# Patient Record
Sex: Female | Born: 2000 | Race: White | Hispanic: No | Marital: Single | Smoking: Never smoker
Health system: Southern US, Community
[De-identification: ages and names within clinical notes are randomized; demographics above are authoritative.]

## PROBLEM LIST (undated history)

## (undated) DIAGNOSIS — D649 Anemia, unspecified: Secondary | ICD-10-CM

---
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
## 2019-03-01 LAB — OB RESULTS CONSOLE HIV ANTIBODY (ROUTINE TESTING): HIV: NONREACTIVE

## 2019-03-01 LAB — OB RESULTS CONSOLE HEPATITIS B SURFACE ANTIGEN: Hepatitis B Surface Ag: NEGATIVE

## 2019-03-03 ENCOUNTER — Other Ambulatory Visit: Payer: Self-pay

## 2019-03-03 ENCOUNTER — Encounter (HOSPITAL_COMMUNITY): Payer: Self-pay | Admitting: *Deleted

## 2019-03-03 ENCOUNTER — Inpatient Hospital Stay (HOSPITAL_COMMUNITY)
Admission: AD | Admit: 2019-03-03 | Discharge: 2019-03-03 | Disposition: A | Discharge status: Home / Self Care | Payer: Medicaid Other | Source: Ambulatory Visit | Attending: Obstetrics and Gynecology | Admitting: Obstetrics and Gynecology

## 2019-03-03 DIAGNOSIS — D696 Thrombocytopenia, unspecified: Secondary | ICD-10-CM

## 2019-03-03 DIAGNOSIS — Z3689 Encounter for other specified antenatal screening: Secondary | ICD-10-CM

## 2019-03-03 DIAGNOSIS — O99013 Anemia complicating pregnancy, third trimester: Secondary | ICD-10-CM

## 2019-03-03 DIAGNOSIS — O99113 Other diseases of the blood and blood-forming organs and certain disorders involving the immune mechanism complicating pregnancy, third trimester: Secondary | ICD-10-CM | POA: Diagnosis not present

## 2019-03-03 DIAGNOSIS — Z3A30 30 weeks gestation of pregnancy: Secondary | ICD-10-CM

## 2019-03-03 DIAGNOSIS — D649 Anemia, unspecified: Secondary | ICD-10-CM | POA: Insufficient documentation

## 2019-03-03 DIAGNOSIS — O99119 Other diseases of the blood and blood-forming organs and certain disorders involving the immune mechanism complicating pregnancy, unspecified trimester: Secondary | ICD-10-CM

## 2019-03-03 DIAGNOSIS — R51 Headache: Secondary | ICD-10-CM | POA: Diagnosis present

## 2019-03-03 LAB — URINALYSIS, ROUTINE W REFLEX MICROSCOPIC
Bilirubin Urine: NEGATIVE
Glucose, UA: NEGATIVE mg/dL
Hgb urine dipstick: NEGATIVE
Ketones, ur: NEGATIVE mg/dL
Nitrite: NEGATIVE
Protein, ur: NEGATIVE mg/dL
Specific Gravity, Urine: 1.009 (ref 1.005–1.030)
pH: 6 (ref 5.0–8.0)

## 2019-03-03 LAB — CBC
HCT: 30.5 % — ABNORMAL LOW (ref 36.0–46.0)
Hemoglobin: 9.7 g/dL — ABNORMAL LOW (ref 12.0–15.0)
MCH: 27.1 pg (ref 26.0–34.0)
MCHC: 31.8 g/dL (ref 30.0–36.0)
MCV: 85.2 fL (ref 80.0–100.0)
Platelets: 143 10*3/uL — ABNORMAL LOW (ref 150–400)
RBC: 3.58 MIL/uL — ABNORMAL LOW (ref 3.87–5.11)
RDW: 14 % (ref 11.5–15.5)
WBC: 12.6 10*3/uL — ABNORMAL HIGH (ref 4.0–10.5)
nRBC: 0 % (ref 0.0–0.2)

## 2019-03-03 MED ORDER — CYCLOBENZAPRINE HCL 10 MG PO TABS: 10.0000 mg | ORAL_TABLET | Freq: Three times a day (TID) | ORAL | Status: DC | PRN

## 2019-03-03 MED ORDER — METOCLOPRAMIDE HCL 10 MG PO TABS
10.0000 mg | ORAL_TABLET | Freq: Once | ORAL | Status: AC
  Administered 2019-03-03: 20:00:00 10 mg via ORAL
  Filled 2019-03-03: qty 1

## 2019-03-03 MED ORDER — DIPHENHYDRAMINE HCL 25 MG PO CAPS
25.0000 mg | ORAL_CAPSULE | Freq: Once | ORAL | Status: AC
Start: 2019-03-03 — End: 2019-03-03
  Administered 2019-03-03: 25 mg via ORAL
  Filled 2019-03-03: qty 1

## 2019-03-03 MED ORDER — ACETAMINOPHEN 500 MG PO TABS
1000.0000 mg | ORAL_TABLET | Freq: Four times a day (QID) | ORAL | Status: DC | PRN
  Filled 2019-03-03: qty 2

## 2019-03-03 MED ORDER — POLYSACCHARIDE IRON COMPLEX 150 MG PO CAPS: 150.0000 mg | ORAL_CAPSULE | Freq: Every day | ORAL | 2 refills | Status: AC

## 2019-03-03 NOTE — MAU Note (Signed)
Pt reports over the past few days she has been dizzy and lightheaded and has had headaches off and on.

## 2019-03-03 NOTE — MAU Note (Signed)
Verified pt's allergies due to order for Tylenol. Per [name], pt verified she can take Tylenol. Pt asked again if can take Tylenol prior to med admin and pt states," I have never taken Tylenol since allergy of Triaminic. I have only taken Midol and Ibuprofen for pain". [name] aware. Tylenol order cancelled and was not administered to pt.  Leslie Robertson

## 2019-03-03 NOTE — MAU Provider Note (Addendum)
History     CSN: [id number]  Arrival date and time: 03/03/19 1740   First Provider Initiated Contact with Patient 03/03/19 1843      Chief Complaint  Patient presents with  . Dizziness  . Headache   18 y.o. G1 @30 .0 wks presenting with HA and lightheadedness. Sx started 1 week ago. Symptoms occur almost daily. Describes HA as frontal and tension-like. No associated visual disturbances or nausea. No fevers. Rates pain 2/10. Has not tried anything for it. Has checked BPs at home which were normal. Denies precipitating factors for the lightheadedness. No syncope. Reports eating and drinking well. Denies VB, LOF, ctx. +FM but decreased today.   OB History    Gravida  1   Para      Term      Preterm      AB      Living        SAB      TAB      Ectopic      Multiple      Live Births              History reviewed. No pertinent past medical history.  Past Surgical History:  Procedure Laterality Date  . HARDWARE REMOVAL Right 05/20/2014   Procedure: HARDWARE REMOVAL RIGHT DISTAL TIBIA;  Surgeon: Mcarthur Rossetti, MD;  Location: [facility];  Service: Orthopedics;  Laterality: Right;  . ORIF TIBIA FRACTURE Right 02/01/2014   Procedure: OPEN REDUCTION INTERNAL FIXATION (ORIF) TIBIA FRACTURE;  Surgeon: Rozanna Box, MD;  Location: [facility];  Service: Orthopedics;  Laterality: Right;    Family History  Problem Relation Age of Onset  . Cancer Mother   . Diabetes Mother   . Heart disease Paternal Grandmother   . Heart disease Father     Social History   Tobacco Use  . Smoking status: Never Smoker  . Smokeless tobacco: Never Used  Substance Use Topics  . Alcohol use: No  . Drug use: No   Allergies:  Allergies  Allergen Reactions  . Omnicef [Cefdinir] Hives  . Triaminic Fever Reducer [Acetaminophen] Hives   Medications Prior to Admission  Medication Sig Dispense Refill Last Dose  . Multiple Vitamins-Iron (MULTIVITAMINS WITH IRON) TABS tablet Take 1 tablet  by mouth daily.     . prenatal vitamin w/FE, FA (PRENATAL 1 + 1) 27-1 MG TABS tablet Take 1 tablet by mouth daily at 12 noon.   03/03/2019 at 1430  . etonogestrel (IMPLANON) 68 MG IMPL implant Inject 1 each into the skin once. Sept 29, 2014     . HYDROcodone-acetaminophen (NORCO) 5-325 MG per tablet Take 1-2 tablets by mouth every 4 (four) hours as needed for moderate pain. 60 tablet 0   . ibuprofen (ADVIL,MOTRIN) 200 MG tablet Take 200-400 mg by mouth once as needed for headache.       Review of Systems  Constitutional: Negative for fever.  HENT: Negative for congestion.   Respiratory: Negative for cough and shortness of breath.   Gastrointestinal: Negative for abdominal pain.  Genitourinary: Negative for vaginal bleeding and vaginal discharge.  Neurological: Positive for dizziness, light-headedness and headaches. Negative for syncope.   Physical Exam   Blood pressure 128/71, pulse 96, temperature 98.8 F (37.1 C), temperature source Oral, resp. rate 18, height 5\' 8"  (1.727 m), weight 99.8 kg. Orthostatic VS for the past 24 hrs:  BP- Lying Pulse- Lying BP- Sitting Pulse- Sitting BP- Standing at 0 minutes Pulse- Standing at 0 minutes  03/03/19 1945 103/55 89 119/71 93 115/55 85   Patient Vitals for the past 24 hrs:  BP Temp Temp src Pulse Resp Height Weight  03/03/19 1759 128/71 98.8 F (37.1 C) Oral 96 18 5\' 8"  (1.727 m) 99.8 kg   Physical Exam  Nursing note and vitals reviewed. Constitutional: She is oriented to person, place, and time. She appears well-developed and well-nourished. No distress.  HENT:  Head: Normocephalic and atraumatic.  Right Ear: Hearing, tympanic membrane, external ear and ear canal normal.  Left Ear: Hearing, tympanic membrane, external ear and ear canal normal.  Eyes: Pupils are equal, round, and reactive to light.  Neck: Normal range of motion.  Cardiovascular: Normal rate, regular rhythm and normal heart sounds.  Respiratory: Effort normal and breath  sounds normal. No respiratory distress. She has no wheezes. She has no rales.  GI: Soft. She exhibits no distension. There is no abdominal tenderness.  gravid  Musculoskeletal: Normal range of motion.        General: No edema.  Neurological: She is alert and oriented to person, place, and time. She has normal reflexes.  Skin: Skin is warm and dry.  Psychiatric: She has a normal mood and affect.  EFM: 140 bpm, mod variability, + accels, no decels Toco: none  Results for orders placed or performed during the hospital encounter of 03/03/19 (from the past 24 hour(s))  Urinalysis, Routine w reflex microscopic     Status: Abnormal   Collection Time: 03/03/19  6:15 PM  Result Value Ref Range   Color, Urine STRAW (A) YELLOW   APPearance CLEAR CLEAR   Specific Gravity, Urine 1.009 1.005 - 1.030   pH 6.0 5.0 - 8.0   Glucose, UA NEGATIVE NEGATIVE mg/dL   Hgb urine dipstick NEGATIVE NEGATIVE   Bilirubin Urine NEGATIVE NEGATIVE   Ketones, ur NEGATIVE NEGATIVE mg/dL   Protein, ur NEGATIVE NEGATIVE mg/dL   Nitrite NEGATIVE NEGATIVE   Leukocytes,Ua TRACE (A) NEGATIVE   RBC / HPF 0-5 0 - 5 RBC/hpf   WBC, UA 0-5 0 - 5 WBC/hpf   Bacteria, UA RARE (A) NONE SEEN   Squamous Epithelial / LPF 0-5 0 - 5   Mucus PRESENT   CBC     Status: Abnormal   Collection Time: 03/03/19  7:09 PM  Result Value Ref Range   WBC 12.6 (H) 4.0 - 10.5 K/uL   RBC 3.58 (L) 3.87 - 5.11 MIL/uL   Hemoglobin 9.7 (L) 12.0 - 15.0 g/dL   HCT 16.130.5 (L) 09.636.0 - 04.546.0 %   MCV 85.2 80.0 - 100.0 fL   MCH 27.1 26.0 - 34.0 pg   MCHC 31.8 30.0 - 36.0 g/dL   RDW 40.914.0 81.111.5 - 91.415.5 %   Platelets 143 (L) 150 - 400 K/uL   nRBC 0.0 0.0 - 0.2 %   MAU Course  Procedures Meds ordered this encounter  Medications  . DISCONTD: acetaminophen (TYLENOL) tablet 1,000 mg  . DISCONTD: cyclobenzaprine (FLEXERIL) tablet 10 mg  . metoCLOPramide (REGLAN) tablet 10 mg  . diphenhydrAMINE (BENADRYL) capsule 25 mg  . iron polysaccharides (NIFEREX) 150 MG  capsule    Sig: Take 1 capsule (150 mg total) by mouth daily.    Dispense:  30 capsule    Refill:  2    Order Specific Question:   Supervising Provider    Answer:   Conan BowensDAVIS, KELLY M [[id number]]   MDM Labs ordered and reviewed. HA improved after Reglan and Benadryl. Reports good FM since EFM applied,  NST reactive, pt reassured. Will treat anemia which could be causing sx. Mild thrombocytopenia noted. Stable for discharge home.   Assessment and Plan   1. [redacted] weeks gestation of pregnancy   2. NST (non-stress test) reactive   3. Anemia during pregnancy in third trimester   4. Thrombocytopenia affecting pregnancy (HCC)    [facility] Rx Ferrex Increase Fe rich foods FMCs  Allergies as of 03/03/2019      Reactions   Omnicef [cefdinir] Hives   Triaminic Fever Reducer [acetaminophen] Hives      Medication List    STOP taking these medications   HYDROcodone-acetaminophen 5-325 MG tablet Commonly known as: Norco   ibuprofen 200 MG tablet Commonly known as: ADVIL   Implanon 68 MG Impl implant Generic drug: etonogestrel   multivitamins with iron Tabs tablet     TAKE these medications   iron polysaccharides 150 MG capsule Commonly known as: NIFEREX Take 1 capsule (150 mg total) by mouth daily.   prenatal vitamin w/FE, FA 27-1 MG Tabs tablet Take 1 tablet by mouth daily at 12 noon.      Donette LarryMelanie Brownie Gockel, CNM 03/03/2019, 9:28 PM

## 2019-03-03 NOTE — Discharge Instructions (Signed)
Pregnancy and Anemia ° °Anemia is a condition in which the concentration of red blood cells, or hemoglobin, in the blood is below normal. Hemoglobin is a substance in red blood cells that carries oxygen to the tissues of the body. Anemia results when enough oxygen does not reach these tissues. °Anemia is common during pregnancy because the woman's body needs more blood volume and blood cells to provide nutrition to the fetus. The fetus needs iron and folic acid as it is developing. Your body may not produce enough red blood cells because of this. Also, during pregnancy, the liquid part of the blood (plasma) increases by about 30-50%, and the red blood cells increase by only 20%. This lowers the concentration of the red blood cells and creates a natural anemia-like situation. °What are the causes? °The most common cause of anemia during pregnancy is not having enough iron in the body to make red blood cells (iron deficiency anemia). Other causes may include: °· Folic acid deficiency. °· Vitamin B12 deficiency. °· Certain prescription or over-the-counter medicines. °· Certain medical conditions or infections that destroy red blood cells. °· A low platelet count and bleeding caused by antibodies that go through the placenta to the fetus from the mother’s blood. °What are the signs or symptoms? °Mild anemia may not be noticeable. If it becomes severe, symptoms may include: °· Feeling tired (fatigue). °· Shortness of breath, especially during activity. °· Weakness. °· Fainting. °· Pale looking skin. °· Headaches. °· A fast or irregular heartbeat (palpitations). °· Dizziness. °How is this diagnosed? °This condition may be diagnosed based on: °· Your medical history and a physical exam. °· Blood tests. °How is this treated? °Treatment for anemia during pregnancy depends on the cause of the anemia. Treatment can include: °· Dietary changes. °· Supplements of iron, vitamin B12, or folic acid. °· A blood transfusion. This may  be needed if anemia is severe. °· Hospitalization. This may be needed if there is a lot of blood loss or severe anemia. °Follow these instructions at home: °· Follow recommendations from your dietitian or health care provider about changing your diet. °· Increase your vitamin C intake. This will help the stomach absorb more iron. Some foods that are high in vitamin C include: °? Oranges. °? Peppers. °? Tomatoes. °? Mangoes. °· Eat a diet rich in iron. This would include foods such as: °? Liver. °? Beef. °? Eggs. °? Whole grains. °? Spinach. °? Dried fruit. °· Take iron and vitamins as told by your health care provider. °· Eat green leafy vegetables. These are a good source of folic acid. °· Keep all follow-up visits as told by your health care provider. This is important. °Contact a health care provider if: °· You have frequent or lasting headaches. °· You look pale. °· You bruise easily. °Get help right away if: °· You have extreme weakness, shortness of breath, or chest pain. °· You become dizzy or have trouble concentrating. °· You have heavy vaginal bleeding. °· You develop a rash. °· You have bloody or black, tarry stools. °· You faint. °· You vomit up blood. °· You vomit repeatedly. °· You have abdominal pain. °· You have a fever. °· You are dehydrated. °Summary °· Anemia is a condition in which the concentration of red blood cells or hemoglobin in the blood is below normal. °· Anemia is common during pregnancy because the woman's body needs more blood volume and blood cells to provide nutrition to the fetus. °· The most   common cause of anemia during pregnancy is not having enough iron in the body to make red blood cells (iron deficiency anemia).  Mild anemia may not be noticeable. If it becomes severe, symptoms may include feeling tired and weak. This information is not intended to replace advice given to you by your health care provider. Make sure you discuss any questions you have with your health care  provider. Document Released: 08/09/2000 Document Revised: 12/04/2018 Document Reviewed: 09/17/2016 Elsevier Patient Education  2020 [employer].   Fetal Movement Counts Patient Name: ________________________________________________ Patient Due Date: ____________________ What is a fetal movement count?  A fetal movement count is the number of times that you feel your baby move during a certain amount of time. This may also be called a fetal kick count. A fetal movement count is recommended for every pregnant woman. You may be asked to start counting fetal movements as early as week 28 of your pregnancy. Pay attention to when your baby is most active. You may notice your baby's sleep and wake cycles. You may also notice things that make your baby move more. You should do a fetal movement count:  When your baby is normally most active.  At the same time each day. A good time to count movements is while you are resting, after having something to eat and drink. How do I count fetal movements? 1. Find a quiet, comfortable area. Sit, or lie down on your side. 2. Write down the date, the start time and stop time, and the number of movements that you felt between those two times. Take this information with you to your health care visits. 3. For 2 hours, count kicks, flutters, swishes, rolls, and jabs. You should feel at least 10 movements during 2 hours. 4. You may stop counting after you have felt 10 movements. 5. If you do not feel 10 movements in 2 hours, have something to eat and drink. Then, keep resting and counting for 1 hour. If you feel at least 4 movements during that hour, you may stop counting. Contact a health care provider if:  You feel fewer than 4 movements in 2 hours.  Your baby is not moving like he or she usually does. Date: ____________ Start time: ____________ Stop time: ____________ Movements: ____________ Date: ____________ Start time: ____________ Stop time: ____________  Movements: ____________ Date: ____________ Start time: ____________ Stop time: ____________ Movements: ____________ Date: ____________ Start time: ____________ Stop time: ____________ Movements: ____________ Date: ____________ Start time: ____________ Stop time: ____________ Movements: ____________ Date: ____________ Start time: ____________ Stop time: ____________ Movements: ____________ Date: ____________ Start time: ____________ Stop time: ____________ Movements: ____________ Date: ____________ Start time: ____________ Stop time: ____________ Movements: ____________ Date: ____________ Start time: ____________ Stop time: ____________ Movements: ____________ This information is not intended to replace advice given to you by your health care provider. Make sure you discuss any questions you have with your health care provider. Document Released: 09/11/2006 Document Revised: 09/01/2018 Document Reviewed: 09/21/2015 Elsevier Patient Education  2020 [employer].

## 2019-03-03 NOTE — Progress Notes (Signed)
EFM tracing sketchy @ times

## 2019-04-13 ENCOUNTER — Ambulatory Visit (INDEPENDENT_AMBULATORY_CARE_PROVIDER_SITE_OTHER): Payer: Self-pay | Admitting: Pediatrics

## 2019-04-13 ENCOUNTER — Other Ambulatory Visit: Payer: Self-pay

## 2019-04-13 DIAGNOSIS — Z7681 Expectant parent(s) prebirth pediatrician visit: Secondary | ICD-10-CM

## 2019-04-13 NOTE — Progress Notes (Signed)
Prenatal counseling for impending newborn done--  1st child, currently 82SUO, no complications, early prenatal care at 4-6wks Z28.81

## 2019-04-15 LAB — OB RESULTS CONSOLE GBS: GBS: NEGATIVE

## 2019-04-15 LAB — OB RESULTS CONSOLE GC/CHLAMYDIA
Chlamydia: NEGATIVE
Gonorrhea: NEGATIVE

## 2019-05-01 ENCOUNTER — Encounter (HOSPITAL_COMMUNITY): Payer: Self-pay

## 2019-05-01 ENCOUNTER — Other Ambulatory Visit: Payer: Self-pay

## 2019-05-01 ENCOUNTER — Inpatient Hospital Stay (HOSPITAL_COMMUNITY)
Admission: AD | Admit: 2019-05-01 | Discharge: 2019-05-04 | DRG: 806 | Disposition: A | Discharge status: Home / Self Care | Payer: Medicaid Other | Attending: Obstetrics and Gynecology | Admitting: Obstetrics and Gynecology

## 2019-05-01 DIAGNOSIS — O99214 Obesity complicating childbirth: Secondary | ICD-10-CM | POA: Diagnosis present

## 2019-05-01 DIAGNOSIS — O133 Gestational [pregnancy-induced] hypertension without significant proteinuria, third trimester: Secondary | ICD-10-CM

## 2019-05-01 DIAGNOSIS — Z20828 Contact with and (suspected) exposure to other viral communicable diseases: Secondary | ICD-10-CM | POA: Diagnosis present

## 2019-05-01 DIAGNOSIS — E669 Obesity, unspecified: Secondary | ICD-10-CM | POA: Diagnosis present

## 2019-05-01 DIAGNOSIS — D696 Thrombocytopenia, unspecified: Secondary | ICD-10-CM | POA: Diagnosis present

## 2019-05-01 DIAGNOSIS — Z23 Encounter for immunization: Secondary | ICD-10-CM

## 2019-05-01 DIAGNOSIS — O134 Gestational [pregnancy-induced] hypertension without significant proteinuria, complicating childbirth: Principal | ICD-10-CM | POA: Diagnosis present

## 2019-05-01 DIAGNOSIS — O139 Gestational [pregnancy-induced] hypertension without significant proteinuria, unspecified trimester: Secondary | ICD-10-CM | POA: Diagnosis present

## 2019-05-01 DIAGNOSIS — O9902 Anemia complicating childbirth: Secondary | ICD-10-CM | POA: Diagnosis present

## 2019-05-01 DIAGNOSIS — Z3A38 38 weeks gestation of pregnancy: Secondary | ICD-10-CM

## 2019-05-01 DIAGNOSIS — O9912 Other diseases of the blood and blood-forming organs and certain disorders involving the immune mechanism complicating childbirth: Secondary | ICD-10-CM | POA: Diagnosis present

## 2019-05-01 DIAGNOSIS — D649 Anemia, unspecified: Secondary | ICD-10-CM | POA: Diagnosis present

## 2019-05-01 HISTORY — DX: Anemia, unspecified: D64.9

## 2019-05-01 LAB — URINALYSIS, ROUTINE W REFLEX MICROSCOPIC
Bilirubin Urine: NEGATIVE
Glucose, UA: NEGATIVE mg/dL
Hgb urine dipstick: NEGATIVE
Ketones, ur: NEGATIVE mg/dL
Nitrite: POSITIVE — AB
Protein, ur: NEGATIVE mg/dL
Specific Gravity, Urine: 1.012 (ref 1.005–1.030)
pH: 6 (ref 5.0–8.0)

## 2019-05-01 LAB — COMPREHENSIVE METABOLIC PANEL
ALT: 16 U/L (ref 0–44)
AST: 19 U/L (ref 15–41)
Albumin: 2.5 g/dL — ABNORMAL LOW (ref 3.5–5.0)
Alkaline Phosphatase: 178 U/L — ABNORMAL HIGH (ref 38–126)
Anion gap: 9 (ref 5–15)
BUN: 9 mg/dL (ref 6–20)
CO2: 22 mmol/L (ref 22–32)
Calcium: 9.3 mg/dL (ref 8.9–10.3)
Chloride: 104 mmol/L (ref 98–111)
Creatinine, Ser: 0.67 mg/dL (ref 0.44–1.00)
GFR calc Af Amer: >60 mL/min (ref >60)
GFR calc non Af Amer: >60 mL/min (ref >60)
Glucose, Bld: 86 mg/dL (ref 70–99)
Potassium: 4 mmol/L (ref 3.5–5.1)
Sodium: 135 mmol/L (ref 135–145)
Total Bilirubin: 0.4 mg/dL (ref 0.3–1.2)
Total Protein: 6.3 g/dL — ABNORMAL LOW (ref 6.5–8.1)

## 2019-05-01 LAB — CBC
HCT: 31.6 % — ABNORMAL LOW (ref 36.0–46.0)
Hemoglobin: 9.9 g/dL — ABNORMAL LOW (ref 12.0–15.0)
MCH: 26 pg (ref 26.0–34.0)
MCHC: 31.3 g/dL (ref 30.0–36.0)
MCV: 82.9 fL (ref 80.0–100.0)
Platelets: 149 10*3/uL — ABNORMAL LOW (ref 150–400)
RBC: 3.81 MIL/uL — ABNORMAL LOW (ref 3.87–5.11)
RDW: 14.6 % (ref 11.5–15.5)
WBC: 12.1 10*3/uL — ABNORMAL HIGH (ref 4.0–10.5)
nRBC: 0 % (ref 0.0–0.2)

## 2019-05-01 LAB — WET PREP, GENITAL
Clue Cells Wet Prep HPF POC: NONE SEEN
Sperm: NONE SEEN
Trich, Wet Prep: NONE SEEN

## 2019-05-01 LAB — POCT FERN TEST: POCT Fern Test: NEGATIVE

## 2019-05-01 LAB — PROTEIN / CREATININE RATIO, URINE
Creatinine, Urine: 84.04 mg/dL
Protein Creatinine Ratio: 0.08 mg/mg{Cre} (ref 0.00–0.15)
Total Protein, Urine: 7 mg/dL

## 2019-05-01 LAB — AMNISURE RUPTURE OF MEMBRANE (ROM) NOT AT ARMC: Amnisure ROM: NEGATIVE

## 2019-05-01 NOTE — MAU Provider Note (Signed)
Chief Complaint:  Vaginal Discharge   First Provider Initiated Contact with Patient 05/01/19 2116      HPI: Leslie Robertson is a 18 y.o. G1P0 at [redacted]w[redacted]d who presents to maternity admissions reporting leaking of clear fluid, enough to wet her underwear x 2 but not requiring a pad today. The fluid was clear without odor. She reports mild irregular contractions. There are no other symptoms. She has not tried any treatments. She reports good fetal movement.  Pregnancy has been complicated by thrombocytopenia with platelets stable in 140s since 28 week labs.   HPI  Past Medical History: Past Medical History:  Diagnosis Date  . Anemia     Past obstetric history: OB History  Gravida Para Term Preterm AB Living  1            SAB TAB Ectopic Multiple Live Births               # Outcome Date GA Lbr Len/2nd Weight Sex Delivery Anes PTL Lv  1 Current             Past Surgical History: Past Surgical History:  Procedure Laterality Date  . HARDWARE REMOVAL Right 05/20/2014   Procedure: HARDWARE REMOVAL RIGHT DISTAL TIBIA;  Surgeon: Kathryne Hitch, MD;  Location: [facility];  Service: Orthopedics;  Laterality: Right;  . ORIF TIBIA FRACTURE Right 02/01/2014   Procedure: OPEN REDUCTION INTERNAL FIXATION (ORIF) TIBIA FRACTURE;  Surgeon: Budd Palmer, MD;  Location: [facility];  Service: Orthopedics;  Laterality: Right;    Family History: Family History  Problem Relation Age of Onset  . Cancer Mother   . Diabetes Mother   . Heart disease Paternal Grandmother   . Heart disease Father     Social History: Social History   Tobacco Use  . Smoking status: Never Smoker  . Smokeless tobacco: Never Used  Substance Use Topics  . Alcohol use: No  . Drug use: No    Allergies:  Allergies  Allergen Reactions  . Omnicef [Cefdinir] Hives  . Triaminic Fever Reducer [Acetaminophen] Hives    Meds:  Medications Prior to Admission  Medication Sig Dispense Refill Last Dose  . iron  polysaccharides (NIFEREX) 150 MG capsule Take 1 capsule (150 mg total) by mouth daily. 30 capsule 2   . prenatal vitamin w/FE, FA (PRENATAL 1 + 1) 27-1 MG TABS tablet Take 1 tablet by mouth daily at 12 noon.       ROS:  Review of Systems  Constitutional: Negative for chills, fatigue and fever.  Eyes: Negative for visual disturbance.  Respiratory: Negative for shortness of breath.   Cardiovascular: Negative for chest pain.  Gastrointestinal: Negative for abdominal pain, nausea and vomiting.  Genitourinary: Positive for vaginal discharge. Negative for difficulty urinating, dysuria, flank pain, pelvic pain, vaginal bleeding and vaginal pain.  Neurological: Negative for dizziness and headaches.       Pt reports pressure in her head x 2 days that is not painful   Psychiatric/Behavioral: Negative.      I have reviewed patient's Past Medical Hx, Surgical Hx, Family Hx, Social Hx, medications and allergies.   Physical Exam   Patient Vitals for the past 24 hrs:  BP Temp Pulse Resp  05/02/19 0030 (!) 142/85 - (!) 103 -  05/02/19 0000 (!) 146/94 - (!) 101 -  05/01/19 2345 125/76 - 97 -  05/01/19 2330 133/87 - (!) 102 -  05/01/19 2315 134/88 - 96 -  05/01/19 2300 (!) 123/96 -  84 -  05/01/19 2245 128/84 - 95 -  05/01/19 2230 120/73 - 95 -  05/01/19 2215 139/86 - 90 -  05/01/19 2200 133/81 - 98 -  05/01/19 2145 136/85 - 94 -  05/01/19 2130 (!) 137/92 - 95 -  05/01/19 2115 (!) 150/86 - 87 -  05/01/19 2100 (!) 145/91 - 98 -  05/01/19 2045 (!) 145/86 - 87 -  05/01/19 2030 137/85 - 92 -  05/01/19 2018 127/75 - 85 -  05/01/19 2008 139/83 - 85 -  05/01/19 1952 129/79 - (!) 101 -  05/01/19 1938 (!) 142/92 99.1 F (37.3 C) (!) 122 18   Constitutional: Well-developed, well-nourished female in no acute distress.  Cardiovascular: normal rate Respiratory: normal effort GI: Abd soft, non-tender, gravid appropriate for gestational age.  MS: Extremities nontender, no edema, normal  ROM Neurologic: Alert and oriented x 4.  GU: Neg CVAT.  PELVIC EXAM: Cervix pink, visually closed, without lesion, scant white creamy discharge, vaginal walls and external genitalia normal Bimanual exam: Cervix 0/long/high, firm, anterior, neg CMT, uterus nontender, nonenlarged, adnexa without tenderness, enlargement, or mass  Dilation: 1 Effacement (%): 50 Cervical Position: Posterior Station: Ballotable Presentation: Vertex Exam by:: Sharen CounterLisa Leftwich-Kirby CNM   FHT:  Baseline 135, moderate variability, accelerations present, no decelerations Contractions: irregular, mild to palpation   Labs: Results for orders placed or performed during the hospital encounter of 05/01/19 (from the past 24 hour(s))  POCT fern test     Status: None   Collection Time: 05/01/19  8:20 PM  Result Value Ref Range   POCT Fern Test Negative = intact amniotic membranes   Protein / creatinine ratio, urine     Status: None   Collection Time: 05/01/19  8:58 PM  Result Value Ref Range   Creatinine, Urine 84.04 mg/dL   Total Protein, Urine 7 mg/dL   Protein Creatinine Ratio 0.08 0.00 - 0.15 mg/mg[Cre]  Urinalysis, Routine w reflex microscopic     Status: Abnormal   Collection Time: 05/01/19  8:58 PM  Result Value Ref Range   Color, Urine YELLOW YELLOW   APPearance HAZY (A) CLEAR   Specific Gravity, Urine 1.012 1.005 - 1.030   pH 6.0 5.0 - 8.0   Glucose, UA NEGATIVE NEGATIVE mg/dL   Hgb urine dipstick NEGATIVE NEGATIVE   Bilirubin Urine NEGATIVE NEGATIVE   Ketones, ur NEGATIVE NEGATIVE mg/dL   Protein, ur NEGATIVE NEGATIVE mg/dL   Nitrite POSITIVE (A) NEGATIVE   Leukocytes,Ua SMALL (A) NEGATIVE   RBC / HPF 0-5 0 - 5 RBC/hpf   WBC, UA 11-20 0 - 5 WBC/hpf   Bacteria, UA RARE (A) NONE SEEN   Squamous Epithelial / LPF 11-20 0 - 5   Mucus PRESENT   CBC     Status: Abnormal   Collection Time: 05/01/19  8:59 PM  Result Value Ref Range   WBC 12.1 (H) 4.0 - 10.5 K/uL   RBC 3.81 (L) 3.87 - 5.11 MIL/uL    Hemoglobin 9.9 (L) 12.0 - 15.0 g/dL   HCT 16.131.6 (L) 09.636.0 - 04.546.0 %   MCV 82.9 80.0 - 100.0 fL   MCH 26.0 26.0 - 34.0 pg   MCHC 31.3 30.0 - 36.0 g/dL   RDW 40.914.6 81.111.5 - 91.415.5 %   Platelets 149 (L) 150 - 400 K/uL   nRBC 0.0 0.0 - 0.2 %  Comprehensive metabolic panel     Status: Abnormal   Collection Time: 05/01/19  8:59 PM  Result Value Ref Range  Sodium 135 135 - 145 mmol/L   Potassium 4.0 3.5 - 5.1 mmol/L   Chloride 104 98 - 111 mmol/L   CO2 22 22 - 32 mmol/L   Glucose, Bld 86 70 - 99 mg/dL   BUN 9 6 - 20 mg/dL   Creatinine, Ser 0.67 0.44 - 1.00 mg/dL   Calcium 9.3 8.9 - 10.3 mg/dL   Total Protein 6.3 (L) 6.5 - 8.1 g/dL   Albumin 2.5 (L) 3.5 - 5.0 g/dL   AST 19 15 - 41 U/L   ALT 16 0 - 44 U/L   Alkaline Phosphatase 178 (H) 38 - 126 U/L   Total Bilirubin 0.4 0.3 - 1.2 mg/dL   GFR calc non Af Amer >60 >60 mL/min   GFR calc Af Amer >60 >60 mL/min   Anion gap 9 5 - 15  Wet prep, genital     Status: Abnormal   Collection Time: 05/01/19 10:20 PM   Specimen: Vaginal; Genital  Result Value Ref Range   Yeast Wet Prep HPF POC PRESENT (A) NONE SEEN   Trich, Wet Prep NONE SEEN NONE SEEN   Clue Cells Wet Prep HPF POC NONE SEEN NONE SEEN   WBC, Wet Prep HPF POC MANY (A) NONE SEEN   Sperm NONE SEEN   Amnisure rupture of membrane (rom)not at [facility]     Status: None   Collection Time: 05/01/19 10:49 PM  Result Value Ref Range   Amnisure ROM NEGATIVE       Imaging:  No results found.  MAU Course/MDM: Orders Placed This Encounter  Procedures  . OB RESULT CONSOLE Group B Strep  . Wet prep, genital  . SARS Coronavirus 2 [facility] order, Performed in [facility] hospital lab) Nasopharyngeal Nasopharyngeal Swab  . CBC  . Comprehensive metabolic panel  . Protein / creatinine ratio, urine  . Urinalysis, Routine w reflex microscopic  . Amnisure rupture of membrane (rom)not at [facility]  . RPR  . Diet clear liquid Room service appropriate? Yes; Fluid consistency: Thin  . Contraction -  monitoring  . External fetal heart monitoring  . Vaginal exam  . Vitals signs per unit policy  . Notify physician for vaginal bleeding, acute abdominal pain, temperature >/= 100.4 F, significant change in vital signs, non-reassuring fetal heart rate pattern, imminent delivery or failure to progress  . Fetal monitoring per unit policy  . Activity as tolerated  . Cervical Exam  . Measure blood pressure post delivery every 15 min x 1 hour then every 30 min x 1 hour  . Fundal check post delivery every 15 min x 1 hour then every 30 min x 1 hour  . If Rapid HIV test positive or known HIV positive: initiate AZT orders  . May in and out cath x 2 for inability to void  . Insert foley catheter  . Discontinue foley prior to vaginal delivery  . Initiate Carrier Fluid Protocol  . Initiate Oral Care Protocol  . Order Rapid HIV per protocol if no results on chart  . Evaluate fetal heart rate to establish reassuring pattern prior to initiating Cytotec or Pitocin  . Perform a cervical exam prior to initiating Cytotec or Pitocin  . Discontinue Pitocin if tachysystole with non-reassuring FHR is present  . Nofify MD/CNM if tachysystole with non-reassuring FHR is present  . Initiate intrauterine resuscitation if tachysystole with non-reasuring FHR is present  . If tachysystole WITH reassuring FHR present notify MD / CNM  . May administer Terbutaline 0.25 mg  SQ x 1 dose if tachysystole with non-reassuring FHR is presesnt  . Labor Induction  . Patient may have epidural placement upon request  . Full code  . POCT fern test  . [facility]  . Insert and maintain IV Line  . Admit to Inpatient (patient's expected length of stay will be greater than 2 midnights or inpatient only procedure)    Meds ordered this encounter  Medications  . lactated ringers infusion  . oxytocin (PITOCIN) IV BOLUS FROM BAG  . oxytocin (PITOCIN) IV infusion 40 units in NS 1000 mL - Premix  .  lactated ringers infusion 500-1,000 mL  . ondansetron (ZOFRAN) injection 4 mg  . sodium citrate-citric acid (ORACIT) solution 30 mL  . lidocaine (PF) (XYLOCAINE) 1 % injection 30 mL  . terbutaline (BRETHINE) injection 0.25 mg  . misoprostol (CYTOTEC) tablet 25 mcg     NST reviewed and reactive Pt with leaking fluid but pooling, ferning, and amnisure negative so no evidence of ROM Wet prep with yeast, urine with nitrites, pt without any symptoms of UTI or yeast infection today but yeast may contribute to increased watery discharge  Pt with elevated BP for the first time in MAU, persistently elevated x 4 hours during evaluation today Pt without h/a but some mild pressure in her head Consult Dr Vergie LivingPickens with presentation, exam findings and test results. Admission and IOL for GHTN recommended. Called Dr Tenny Crawoss who admits pt to L&D for Cytotec induction. Pt stable at time of transfer   Assessment: 1. Gestational hypertension, third trimester   2. GBS negative  Plan: Admit to L&D Cytotec PV Q 4 hours   Sharen CounterLisa Leftwich-Kirby Certified Nurse-Midwife 05/02/2019 12:53 AM

## 2019-05-01 NOTE — MAU Note (Signed)
Pt reports she had some leaking of clear fluid about an hour ago. Also reports some mild cramping/ctx as well. Good fetal movement felt.

## 2019-05-02 ENCOUNTER — Other Ambulatory Visit: Payer: Self-pay

## 2019-05-02 ENCOUNTER — Encounter (HOSPITAL_COMMUNITY): Payer: Self-pay

## 2019-05-02 ENCOUNTER — Inpatient Hospital Stay (HOSPITAL_COMMUNITY): Payer: Medicaid Other | Admitting: Anesthesiology

## 2019-05-02 DIAGNOSIS — D696 Thrombocytopenia, unspecified: Secondary | ICD-10-CM | POA: Diagnosis present

## 2019-05-02 DIAGNOSIS — O139 Gestational [pregnancy-induced] hypertension without significant proteinuria, unspecified trimester: Secondary | ICD-10-CM | POA: Diagnosis present

## 2019-05-02 DIAGNOSIS — D649 Anemia, unspecified: Secondary | ICD-10-CM | POA: Diagnosis present

## 2019-05-02 DIAGNOSIS — O9912 Other diseases of the blood and blood-forming organs and certain disorders involving the immune mechanism complicating childbirth: Secondary | ICD-10-CM | POA: Diagnosis present

## 2019-05-02 DIAGNOSIS — Z23 Encounter for immunization: Secondary | ICD-10-CM | POA: Diagnosis not present

## 2019-05-02 DIAGNOSIS — O99214 Obesity complicating childbirth: Secondary | ICD-10-CM | POA: Diagnosis present

## 2019-05-02 DIAGNOSIS — O26893 Other specified pregnancy related conditions, third trimester: Secondary | ICD-10-CM | POA: Diagnosis present

## 2019-05-02 DIAGNOSIS — O134 Gestational [pregnancy-induced] hypertension without significant proteinuria, complicating childbirth: Secondary | ICD-10-CM | POA: Diagnosis present

## 2019-05-02 DIAGNOSIS — E669 Obesity, unspecified: Secondary | ICD-10-CM | POA: Diagnosis present

## 2019-05-02 DIAGNOSIS — O9902 Anemia complicating childbirth: Secondary | ICD-10-CM | POA: Diagnosis present

## 2019-05-02 DIAGNOSIS — Z3A38 38 weeks gestation of pregnancy: Secondary | ICD-10-CM | POA: Diagnosis not present

## 2019-05-02 DIAGNOSIS — Z20828 Contact with and (suspected) exposure to other viral communicable diseases: Secondary | ICD-10-CM | POA: Diagnosis present

## 2019-05-02 LAB — TYPE AND SCREEN
ABO/RH(D): O POS
Antibody Screen: NEGATIVE

## 2019-05-02 LAB — SARS CORONAVIRUS 2 BY RT PCR (HOSPITAL ORDER, PERFORMED IN ~~LOC~~ HOSPITAL LAB): SARS Coronavirus 2: NEGATIVE

## 2019-05-02 LAB — RPR: RPR Ser Ql: NONREACTIVE

## 2019-05-02 LAB — ABO/RH: ABO/RH(D): O POS

## 2019-05-02 MED ORDER — ACETAMINOPHEN 325 MG PO TABS: 650.0000 mg | ORAL_TABLET | ORAL | Status: DC | PRN

## 2019-05-02 MED ORDER — OXYTOCIN BOLUS FROM INFUSION
500.0000 mL | Freq: Once | INTRAVENOUS | Status: AC
  Administered 2019-05-02: 500 mL via INTRAVENOUS

## 2019-05-02 MED ORDER — ONDANSETRON HCL 4 MG/2ML IJ SOLN: 4.0000 mg | INTRAMUSCULAR | Status: DC | PRN

## 2019-05-02 MED ORDER — LACTATED RINGERS IV SOLN: 500.0000 mL | INTRAVENOUS | Status: DC | PRN

## 2019-05-02 MED ORDER — LACTATED RINGERS IV SOLN
500.0000 mL | Freq: Once | INTRAVENOUS | Status: AC
  Administered 2019-05-02: 500 mL via INTRAVENOUS

## 2019-05-02 MED ORDER — ZOLPIDEM TARTRATE 5 MG PO TABS: 5.0000 mg | ORAL_TABLET | Freq: Every evening | ORAL | Status: DC | PRN

## 2019-05-02 MED ORDER — SIMETHICONE 80 MG PO CHEW: 80.0000 mg | CHEWABLE_TABLET | ORAL | Status: DC | PRN

## 2019-05-02 MED ORDER — OXYTOCIN 40 UNITS IN NORMAL SALINE INFUSION - SIMPLE MED
1.0000 m[IU]/min | INTRAVENOUS | Status: DC
  Administered 2019-05-02: 11:00:00 1 m[IU]/min via INTRAVENOUS

## 2019-05-02 MED ORDER — EPHEDRINE 5 MG/ML INJ: 10.0000 mg | INTRAVENOUS | Status: DC | PRN

## 2019-05-02 MED ORDER — FENTANYL-BUPIVACAINE-NACL 0.5-0.125-0.9 MG/250ML-% EP SOLN
12.0000 mL/h | EPIDURAL | Status: DC | PRN
  Filled 2019-05-02: qty 250

## 2019-05-02 MED ORDER — OXYTOCIN 40 UNITS IN NORMAL SALINE INFUSION - SIMPLE MED
2.5000 [IU]/h | INTRAVENOUS | Status: DC
  Filled 2019-05-02: qty 1000

## 2019-05-02 MED ORDER — PHENYLEPHRINE 40 MCG/ML (10ML) SYRINGE FOR IV PUSH (FOR BLOOD PRESSURE SUPPORT): 80.0000 ug | PREFILLED_SYRINGE | INTRAVENOUS | Status: DC | PRN

## 2019-05-02 MED ORDER — METHYLERGONOVINE MALEATE 0.2 MG PO TABS: 0.2000 mg | ORAL_TABLET | ORAL | Status: DC | PRN

## 2019-05-02 MED ORDER — ONDANSETRON HCL 4 MG PO TABS: 4.0000 mg | ORAL_TABLET | ORAL | Status: DC | PRN

## 2019-05-02 MED ORDER — LIDOCAINE HCL (PF) 1 % IJ SOLN: 30.0000 mL | INTRAMUSCULAR | Status: DC | PRN

## 2019-05-02 MED ORDER — ONDANSETRON HCL 4 MG/2ML IJ SOLN: 4.0000 mg | Freq: Four times a day (QID) | INTRAMUSCULAR | Status: DC | PRN

## 2019-05-02 MED ORDER — DIBUCAINE (PERIANAL) 1 % EX OINT: 1.0000 "application " | TOPICAL_OINTMENT | CUTANEOUS | Status: DC | PRN

## 2019-05-02 MED ORDER — SODIUM CHLORIDE (PF) 0.9 % IJ SOLN
INTRAMUSCULAR | Status: DC | PRN
  Administered 2019-05-02: 12 mL/h via EPIDURAL

## 2019-05-02 MED ORDER — FENTANYL CITRATE (PF) 100 MCG/2ML IJ SOLN
INTRAMUSCULAR | Status: DC | PRN
  Administered 2019-05-02: 100 ug via EPIDURAL

## 2019-05-02 MED ORDER — IBUPROFEN 600 MG PO TABS
600.0000 mg | ORAL_TABLET | Freq: Four times a day (QID) | ORAL | Status: DC
  Administered 2019-05-02 – 2019-05-04 (×8): 600 mg via ORAL
  Filled 2019-05-02 (×9): qty 1

## 2019-05-02 MED ORDER — COCONUT OIL OIL: 1.0000 "application " | TOPICAL_OIL | Status: DC | PRN

## 2019-05-02 MED ORDER — WITCH HAZEL-GLYCERIN EX PADS: 1.0000 "application " | MEDICATED_PAD | CUTANEOUS | Status: DC | PRN

## 2019-05-02 MED ORDER — PRENATAL MULTIVITAMIN CH
1.0000 | ORAL_TABLET | Freq: Every day | ORAL | Status: DC
  Administered 2019-05-03 – 2019-05-04 (×2): 1 via ORAL
  Filled 2019-05-02 (×2): qty 1

## 2019-05-02 MED ORDER — DIPHENHYDRAMINE HCL 50 MG/ML IJ SOLN: 12.5000 mg | INTRAMUSCULAR | Status: DC | PRN

## 2019-05-02 MED ORDER — LACTATED RINGERS IV SOLN: 500.0000 mL | Freq: Once | INTRAVENOUS | Status: DC

## 2019-05-02 MED ORDER — TERBUTALINE SULFATE 1 MG/ML IJ SOLN: 0.2500 mg | Freq: Once | INTRAMUSCULAR | Status: DC | PRN

## 2019-05-02 MED ORDER — TETANUS-DIPHTH-ACELL PERTUSSIS 5-2.5-18.5 LF-MCG/0.5 IM SUSP: 0.5000 mL | Freq: Once | INTRAMUSCULAR | Status: DC

## 2019-05-02 MED ORDER — SOD CITRATE-CITRIC ACID 500-334 MG/5ML PO SOLN: 30.0000 mL | ORAL | Status: DC | PRN

## 2019-05-02 MED ORDER — MISOPROSTOL 25 MCG QUARTER TABLET
25.0000 ug | ORAL_TABLET | ORAL | Status: DC | PRN
  Administered 2019-05-02 (×2): 25 ug via VAGINAL
  Filled 2019-05-02 (×2): qty 1

## 2019-05-02 MED ORDER — SENNOSIDES-DOCUSATE SODIUM 8.6-50 MG PO TABS
2.0000 | ORAL_TABLET | ORAL | Status: DC
  Administered 2019-05-03 (×2): 2 via ORAL
  Filled 2019-05-02 (×2): qty 2

## 2019-05-02 MED ORDER — BENZOCAINE-MENTHOL 20-0.5 % EX AERO
1.0000 "application " | INHALATION_SPRAY | CUTANEOUS | Status: DC | PRN
  Administered 2019-05-02: 1 via TOPICAL
  Filled 2019-05-02: qty 56

## 2019-05-02 MED ORDER — FENTANYL CITRATE (PF) 100 MCG/2ML IJ SOLN
50.0000 ug | INTRAMUSCULAR | Status: DC | PRN
  Administered 2019-05-02 (×2): 50 ug via INTRAVENOUS
  Filled 2019-05-02 (×3): qty 2

## 2019-05-02 MED ORDER — METHYLERGONOVINE MALEATE 0.2 MG/ML IJ SOLN: 0.2000 mg | INTRAMUSCULAR | Status: DC | PRN

## 2019-05-02 MED ORDER — LACTATED RINGERS IV SOLN
INTRAVENOUS | Status: DC
  Administered 2019-05-02 (×2): via INTRAVENOUS

## 2019-05-02 MED ORDER — DIPHENHYDRAMINE HCL 25 MG PO CAPS: 25.0000 mg | ORAL_CAPSULE | Freq: Four times a day (QID) | ORAL | Status: DC | PRN

## 2019-05-02 MED ORDER — LIDOCAINE HCL (PF) 1 % IJ SOLN
INTRAMUSCULAR | Status: DC | PRN
  Administered 2019-05-02 (×2): 4 mL via EPIDURAL

## 2019-05-02 NOTE — Lactation Note (Signed)
This note was copied from a baby's chart. Lactation Consultation Note  Patient Name: Leslie Robertson Today's Date: 05/02/2019 Reason for consult: Initial assessment;Primapara;1st time breastfeeding;Early term 21-38.6wks  3 hours old ETI female who is being exclusively BF by her mother, she's a P1. Mom reported moderate breast changes during the pregnancy, when [facility] assisted mom with hand expression noticed that her breast were spaced out, however she was able to get small drops of colostrum out of both breast doing teach back. LC rubbed colostrum on baby's mouth. Mom's breast are sensitive right now, LC had to be careful about not compressing too hard. She participated in the [facility] program at [facility] and has a [employer] DEBP at home.  Offered assistance with latch but mom told LC that baby just had a feeding, she was asleep on his bassinet. Asked mom to call for assistance when needed. Per mom BF is going well and baby has been feeding well since birth, she's got LATCH scores of 9. Reviewed normal newborn behavior, feeding cues, size of baby's stomach, prevention and treatment for sore nipples. Dad present and engaged during [facility] consultation.  Feeding plan:  1. Encouraged mom to feed baby STS 8-12 times/24 hours or sooner if feeding cues are present 2. Hand expression and finger feeding were also encouraged  BF brochure, BF resources and feeding diary were reviewed. Parents reported all questions and concerns were answered, they're both aware of [place] OP services and will call PRN.  Maternal Data Formula Feeding for Exclusion: No Has patient been taught Hand Expression?: Yes Does the patient have breastfeeding experience prior to this delivery?: No  Feeding Feeding Type: Breast Fed  LATCH Score Latch: Grasps breast easily, tongue down, lips flanged, rhythmical sucking.  Audible Swallowing: A few with stimulation  Type of Nipple: Everted at rest and after stimulation  Comfort  (Breast/Nipple): Soft / non-tender  Hold (Positioning): No assistance needed to correctly position infant at breast.  LATCH Score: 9  Interventions Interventions: Breast feeding basics reviewed;Breast massage;Hand express;Breast compression  Lactation Tools Discussed/Used WIC Program: Yes   Consult Status Consult Status: Follow-up Date: 05/03/19 Follow-up type: In-patient    Jackilyn Umphlett Francene Boyers 05/02/2019, 6:08 PM

## 2019-05-02 NOTE — Anesthesia Preprocedure Evaluation (Signed)
Anesthesia Evaluation  Patient identified by MRN, date of birth, ID band Patient awake    Reviewed: Allergy & Precautions, Patient's Chart, lab work & pertinent test results  History of Anesthesia Complications Negative for: history of anesthetic complications  Airway Mallampati: II  TM Distance: >3 FB Neck ROM: Full    Dental no notable dental hx.    Pulmonary neg pulmonary ROS,    Pulmonary exam normal        Cardiovascular hypertension (gestational), Normal cardiovascular exam     Neuro/Psych negative neurological ROS     GI/Hepatic negative GI ROS, Neg liver ROS,   Endo/Other  negative endocrine ROS  Renal/GU negative Renal ROS     Musculoskeletal negative musculoskeletal ROS (+)   Abdominal   Peds  Hematology  (+) anemia , Gestational thrombocytopenia, plts 149 Hgb 9.9   Anesthesia Other Findings Day of surgery medications reviewed with the patient.  Reproductive/Obstetrics (+) Pregnancy                             Anesthesia Physical Anesthesia Plan  ASA: II  Anesthesia Plan: Epidural   Post-op Pain Management:    Induction:   PONV Risk Score and Plan: Treatment may vary due to age or medical condition  Airway Management Planned: Natural Airway  Additional Equipment:   Intra-op Plan:   Post-operative Plan:   Informed Consent: I have reviewed the patients History and Physical, chart, labs and discussed the procedure including the risks, benefits and alternatives for the proposed anesthesia with the patient or authorized representative who has indicated his/her understanding and acceptance.       Plan Discussed with:   Anesthesia Plan Comments:         Anesthesia Quick Evaluation

## 2019-05-02 NOTE — H&P (Signed)
Leslie Robertson is a 18 y.o. female presenting for leaking fluid  18 yo G1P0 At 38+4 presents for leaking fluid. She was ruled out for rupture but was noted to have elevated BPs and met criteria for gestational hypertension. Her pregnancy has been complicated by obesity for which she has been on ASA 81 mg OB History    Gravida  1   Para      Term      Preterm      AB      Living        SAB      TAB      Ectopic      Multiple      Live Births             Past Medical History:  Diagnosis Date  . Anemia    Past Surgical History:  Procedure Laterality Date  . HARDWARE REMOVAL Right 05/20/2014   Procedure: HARDWARE REMOVAL RIGHT DISTAL TIBIA;  Surgeon: Mcarthur Rossetti, MD;  Location: [facility];  Service: Orthopedics;  Laterality: Right;  . ORIF TIBIA FRACTURE Right 02/01/2014   Procedure: OPEN REDUCTION INTERNAL FIXATION (ORIF) TIBIA FRACTURE;  Surgeon: Rozanna Box, MD;  Location: [place];  Service: Orthopedics;  Laterality: Right;   Family History: family history includes Cancer in her mother; Diabetes in her mother; Heart disease in her father and paternal grandmother. Social History:  reports that she has never smoked. She has never used smokeless tobacco. She reports that she does not drink alcohol or use drugs.     Maternal Diabetes: No Genetic Screening: Normal Maternal Ultrasounds/Referrals: Normal Fetal Ultrasounds or other Referrals:  None Maternal Substance Abuse:  No Significant Maternal Medications:  None Significant Maternal Lab Results:  None Other Comments:  None  ROS History Dilation: 1 Effacement (%): 50, 60 Station: -3 Exam by:: Cassie Freer, RN Blood pressure 137/86, pulse 79, temperature 98 F (36.7 C), temperature source Oral, resp. rate 16, height '5\' 8"'$  (1.727 m), weight 111.6 kg, SpO2 99 %. Exam Physical Exam  Prenatal labs: ABO, Rh: --/--/O POS, O POS Performed at [facility], [place] [address] [place], [place]  [address]  [id number]/06 0120) Antibody: NEG (09/06 0120) Rubella:  Imm RPR:   NR HBsAg: Negative (07/06 0000)  HIV: Non-reactive (07/06 0000)  GBS: Negative (08/20 0000)   Assessment/Plan: 1) Admit 2) Misoprostol 42mg Q4hr 3) Labetalol protocol 4) Epidural on request   KVanessa Kick9/01/2019, 9:45 AM

## 2019-05-02 NOTE — Anesthesia Procedure Notes (Signed)
Epidural Patient location during procedure: OB Start time: 05/02/2019 1:10 PM End time: 05/02/2019 1:12 PM  Staffing Anesthesiologist: Brennan Bailey, MD Performed: anesthesiologist   Preanesthetic Checklist Completed: patient identified, pre-op evaluation, timeout performed, IV checked, risks and benefits discussed and monitors and equipment checked  Epidural Patient position: sitting Prep: site prepped and draped and DuraPrep Patient monitoring: continuous pulse ox, blood pressure and heart rate Approach: midline Location: L3-L4 Injection technique: LOR air  Needle:  Needle type: Tuohy  Needle gauge: 17 G Needle length: 9 cm Catheter type: closed end flexible Catheter size: 19 Gauge Catheter at skin depth: 11 cm Test dose: negative and Other (1% lidocaine)  Assessment Events: blood not aspirated, injection not painful, no injection resistance, negative IV test and no paresthesia  Additional Notes Patient identified. Risks, benefits, and alternatives discussed with patient including but not limited to bleeding, infection, nerve damage, paralysis, failed block, incomplete pain control, headache, blood pressure changes, nausea, vomiting, reactions to medication, itching, and postpartum back pain. Confirmed with bedside nurse the patient's most recent platelet count. Confirmed with patient that they are not currently taking any anticoagulation, have any bleeding history, or any family history of bleeding disorders. Patient expressed understanding and wished to proceed. All questions were answered. Sterile technique was used throughout the entire procedure. Please see nursing notes for vital signs.   Crisp LOR on first pass. Test dose was given through epidural catheter and negative prior to continuing to dose epidural or start infusion. Warning signs of high block given to the patient including shortness of breath, tingling/numbness in hands, complete motor block, or any concerning  symptoms with instructions to call for help. Patient was given instructions on fall risk and not to get out of bed. All questions and concerns addressed with instructions to call with any issues or inadequate analgesia.  Reason for block:procedure for pain

## 2019-05-03 LAB — CBC
HCT: 25.2 % — ABNORMAL LOW (ref 36.0–46.0)
Hemoglobin: 7.9 g/dL — ABNORMAL LOW (ref 12.0–15.0)
MCH: 26.1 pg (ref 26.0–34.0)
MCHC: 31.3 g/dL (ref 30.0–36.0)
MCV: 83.2 fL (ref 80.0–100.0)
Platelets: 118 10*3/uL — ABNORMAL LOW (ref 150–400)
RBC: 3.03 MIL/uL — ABNORMAL LOW (ref 3.87–5.11)
RDW: 14.7 % (ref 11.5–15.5)
WBC: 10.9 10*3/uL — ABNORMAL HIGH (ref 4.0–10.5)
nRBC: 0 % (ref 0.0–0.2)

## 2019-05-03 NOTE — Anesthesia Postprocedure Evaluation (Signed)
Anesthesia Post Note  Patient: Leslie Robertson  Procedure(s) Performed: AN AD [place]     Patient location during evaluation: Mother Baby Anesthesia Type: Epidural Level of consciousness: awake and alert Pain management: pain level controlled Vital Signs Assessment: post-procedure vital signs reviewed and stable Respiratory status: spontaneous breathing, nonlabored ventilation and respiratory function stable Cardiovascular status: stable Postop Assessment: no headache, no backache and epidural receding Anesthetic complications: no    Last Vitals:  Vitals:   05/03/19 0045 05/03/19 0506  BP: 122/74 119/77  Pulse: 86 75  Resp: 18 18  Temp: 36.5 C 36.7 C  SpO2: 99% 99%    Last Pain:  Vitals:   05/03/19 0506  TempSrc: Oral  PainSc:    Pain Goal: Patients Stated Pain Goal: 2 (05/02/19 1030)                 Snigdha Howser

## 2019-05-03 NOTE — Progress Notes (Signed)
Patient is doing well.  She is ambulating, voiding, tolerating PO.  Pain control is good.  Lochia is appropriate  Vitals:   05/02/19 1745 05/02/19 2216 05/03/19 0045 05/03/19 0506  BP: 119/79 118/69 122/74 119/77  Pulse: 80 88 86 75  Resp: 18 18 18 18   Temp: 98.3 F (36.8 C) 98.1 F (36.7 C) 97.7 F (36.5 C) 98.1 F (36.7 C)  TempSrc: Oral Oral  Oral  SpO2:  98% 99% 99%  Weight:      Height:        NAD Fundus firm Ext: trace edema b/l  Lab Results  Component Value Date   WBC 10.9 (H) 05/03/2019   HGB 7.9 (L) 05/03/2019   HCT 25.2 (L) 05/03/2019   MCV 83.2 05/03/2019   PLT 118 (L) 05/03/2019    --/--/O POS, O POS Performed at [facility] [address], [place], [place] [address]  (09/06 0120)/RImmune  A/P 18 y.o. G1P1001 PPD#1. Routine care.   GHTN--BPs normalized with delivery, will monitor Breastfeeding, expect d/c tomorrow.    [place]

## 2019-05-03 NOTE — Clinical Social Work Maternal (Signed)
CLINICAL SOCIAL WORK MATERNAL/CHILD NOTE  Patient Details  Name: Leslie Robertson MRN: [id number] Date of Birth: 10/18/2000  Date:  Jul 20, 2019  Clinical Social Worker Initiating Note:  Elijio Miles Date/Time: Initiated:  05/03/19/0914     Child's Name:  Leslie Robertson   Biological Parents:  Mother, Father(Leslie Robertson and Leslie Robertson DOB: 06/30/1998)   Need for Interpreter:  None   Reason for Referral:  Current Substance Use/Substance Use During Pregnancy    Address:  [place] [address]    Phone number:  [phone] (home)     Additional phone number:   Household Members/Support Persons (HM/SP):   Household Member/Support Person 1, Household Member/Support Person 2   HM/SP Name Relationship DOB or Age  HM/SP -[place] Father    HM/SP -2 Melody Public affairs consultant Stepmother    HM/SP -3        HM/SP -4        HM/SP -5        HM/SP -6        HM/SP -7        HM/SP -8          Natural Supports (not living in the home):  Spouse/significant other   Professional Supports: None   Employment: Unemployed   Type of Work:     Education:  Programmer, systems   Homebound arranged:    Museum/gallery curator Resources:  Medicaid   Other Resources:  [facility]   Cultural/Religious Considerations Which May Impact Care:    Strengths:  Ability to meet basic needs , Home prepared for child , Pediatrician chosen   Psychotropic Medications:         Pediatrician:    [place]  Pediatrician List:   [place]      Pediatrician Fax Number:    Risk Factors/Current Problems:      Cognitive State:  Able to Concentrate , Alert , Linear Thinking    Mood/Affect:  Bright , Calm , Comfortable , Interested , Relaxed    CSW Assessment:  CSW received consult for THC use and TOC at 28.2 weeks from [place] but no records from prior to 28.2 weeks.  CSW met with MOB to  offer support and complete assessment.    MOB resting in bed holding infant with FOB asleep on couch, when CSW entered the room. CSW introduced self and received verbal permission from MOB to complete assessment with FOB present. MOB pleasant and engaged throughout assessment. CSW explained reason for consult to which MOB was understanding. Per MOB, she currently lives with her dad and stepmother. MOB stated she receives [facility] and is aware she needs to call and let them know she delivered. CSW inquired about MOB's mental health history and MOB denied having any. CSW provided education regarding the baby blues period vs. perinatal mood disorders, discussed treatment and gave resources for mental health follow up if concerns arise.  CSW recommends self-evaluation during the postpartum time period using the New Mom Checklist from Postpartum Progress and encouraged MOB to contact a medical professional if symptoms are noted at any time. MOB did not appear to be displaying any acute mental health symptoms and denied any SI or HI. MOB reported she has good support from FOB and her parents.   CSW inquired about MOB's substance use history and acknowledged using THC prior to finding  out she was pregnant. Per MOB, her last use was last September. MOB denied any usage during pregnancy. CSW informed MOB of Hospital Drug Policy and explained UDS came back negative but that CDS was still pending and that a CPS report would be made, if warranted. MOB denied any questions or concerns regarding policy.   MOB confirmed having all essential items for infant once discharged and reported infant would be sleeping in a bassinet once home. CSW provided review of Sudden Infant Death Syndrome (SIDS) precautions and safe sleeping habits.     CSW Plan/Description:  No Further Intervention Required/No Barriers to Discharge, Sudden Infant Death Syndrome (SIDS) Education, Perinatal Mood and Anxiety Disorder (PMADs) Education, Reading, CSW Will Continue to Monitor Umbilical Cord Tissue Drug Screen Results and Make Report if Foye Spurling, Stacy 06/10/19, 10:44 AM

## 2019-05-04 MED ORDER — INFLUENZA VAC SPLIT QUAD 0.5 ML IM SUSY
0.5000 mL | PREFILLED_SYRINGE | Freq: Once | INTRAMUSCULAR | Status: AC
  Administered 2019-05-04: 13:00:00 0.5 mL via INTRAMUSCULAR
  Filled 2019-05-04: qty 0.5

## 2019-05-04 NOTE — Lactation Note (Signed)
This note was copied from a baby's chart. Lactation Consultation Note  Patient Name: Leslie Robertson [id number] Date: 05/04/2019 Reason for consult: Follow-up assessment;Primapara;1st time breastfeeding;Infant weight loss  P1 mother whose infant is now 77 hours old.  This is an ETI at 38+4 weeks with a 10% weight loss this morning.    Baby was asleep in mother's arms when I arrived.    Mother has seen the pediatrician and is ready for discharge.  Reviewed feeding 8-12 times/24 hours or sooner if baby shows cues.  Mother will be supplementing after every feeding with formula and stated understanding with this.  She will continue to put baby to breast prior to any supplementation and will feed any amount of EBM she obtains to baby prior to formula.    Engorgement prevention/treatment discussed.  Manual pump provided with instructions for use.  Mother has a DEBP for home use.  She has our OP phone number and virtual breast feeding support group information to use as desired.  Her return pediatric visit is for tomorrow.  Father present.  RN in room at the end of my visit for discharge.   Maternal Data Formula Feeding for Exclusion: No Has patient been taught Hand Expression?: Yes Does the patient have breastfeeding experience prior to this delivery?: No  Feeding    LATCH Score                   Interventions    Lactation Tools Discussed/Used WIC Program: Yes Pump Review: Setup, frequency, and cleaning(Manual pump) Initiated by:: Eleazar Kimmey Date initiated:: 05/04/19   Consult Status Consult Status: Complete Date: 05/04/19 Follow-up type: Call as needed    Sameul Tagle R Ikram Riebe 05/04/2019, 12:01 PM

## 2019-05-04 NOTE — Progress Notes (Signed)
Flu vaccine given.

## 2019-05-04 NOTE — Discharge Summary (Signed)
Obstetric Discharge Summary Reason for Admission: induction of labor Prenatal Procedures: NST Intrapartum Procedures: spontaneous vaginal delivery Postpartum Procedures: none Complications-Operative and Postpartum: 2 degree perineal laceration Hemoglobin  Date Value Ref Range Status  05/03/2019 7.9 (L) 12.0 - 15.0 g/dL Final    Comment:    REPEATED TO VERIFY   HCT  Date Value Ref Range Status  05/03/2019 25.2 (L) 36.0 - 46.0 % Final    Discharge Diagnoses: Term Pregnancy-delivered  Discharge Information: Date: 05/04/2019 Activity: pelvic rest Diet: routine Medications: Ibuprofen and Iron Condition: stable Instructions: refer to practice specific booklet Discharge to: home Follow-up Information    Vanessa Kick, MD Follow up in 4 week(s).   Specialty: Obstetrics and Gynecology Contact information: [place] [address] [phone]           Newborn Data: Live born female  Birth Weight: 7 lb 15.3 oz (3610 g) APGAR: 53, 9  Newborn Delivery   Birth date/time: [birth year]/2019 14:40:00 Delivery type: Vaginal, Spontaneous      Home with mother.  Daria Pastures 05/04/2019, 8:06 AM

## 2019-05-04 NOTE — Progress Notes (Signed)
Patient is eating, ambulating, voiding.  Pain control is good.  Vitals:   05/03/19 0506 05/03/19 1332 05/03/19 2107 05/04/19 0554  BP: 119/77 126/64 118/68 120/77  Pulse: 75 68 83 75  Resp: 18 16 17 16   Temp: 98.1 F (36.7 C) 98 F (36.7 C) 98.7 F (37.1 C) 98.4 F (36.9 C)  TempSrc: Oral Oral Oral Oral  SpO2: 99%   100%  Weight:      Height:        Fundus firm Perineum without swelling.  Lab Results  Component Value Date   WBC 10.9 (H) 05/03/2019   HGB 7.9 (L) 05/03/2019   HCT 25.2 (L) 05/03/2019   MCV 83.2 05/03/2019   PLT 118 (L) 05/03/2019    --/--/O POS, O POS Performed at [facility], [place] [address], [place], [place] [address]  (09/06 0120)/RI  A/P Post partum day 2.  Routine care.  Expect d/c today.  Iron for anemia.  Daria Pastures

## 2019-05-04 NOTE — Lactation Note (Signed)
This note was copied from a baby's chart. Lactation Consultation Note Baby 58 hrs old. Cluster feeding, fussy, mom's nipples are sore and mom is tired. Asked mom if she had a picture of birth wt. And she did. Baby only had 1 stool and 1 void when wt. Was taken, LC feels that 6% is a lot of wt. Loss for less than 24 hrs old. informed mom that Leslie Robertson will assess the next wt. And decide about supplementing at that time. Mom has colostrum when hand expressed.  Breast are small "V" shaped slightly wide spaced between breast. Discussed w/mom pumping for stimulation. Mom agreed but needs to rest right now. Encouraged to let FOB hold baby while she slept since baby was sleeping. Reported to RN to call [place] after wt. Done.  Patient Name: Leslie Robertson Today's Date: 05/04/2019     Maternal Data    Feeding    LATCH Score                   Interventions    Lactation Tools Discussed/Used     Consult Status      Leslie Robertson 05/04/2019, 4:24 AM

## 2019-05-04 NOTE — Lactation Note (Signed)
This note was copied from a baby's chart. Lactation Consultation Note Baby had a 10% wt. Loss.  Set up DEBP for mom. Mom shown how to use DEBP & how to disassemble, clean, & reassemble parts. Mom knows to pump q3h for 15-20 min. Discussed w/mom supplementing. After BF baby was re-weighed. Baby voided and loss even more wt. Gave mom Gerber formula to supplement. Taught pace feeding. Baby tolerated formula well. Reviewed w/mom to give any colostrum first then formula. Reviewed supplementing information sheet. Has no questions. Discussed importance of strict I&O documentation. Mom states understanding. Encouraged to call for questions or assistance.  Patient Name: Leslie Robertson [id number] Date: 05/04/2019 Reason for consult: Follow-up assessment;Primapara;Early term 37-38.6wks   Maternal Data    Feeding Feeding Type: Formula Nipple Type: Slow - flow  LATCH Score Latch: Grasps breast easily, tongue down, lips flanged, rhythmical sucking.  Audible Swallowing: A few with stimulation  Type of Nipple: Everted at rest and after stimulation  Comfort (Breast/Nipple): Filling, red/small blisters or bruises, mild/mod discomfort  Hold (Positioning): No assistance needed to correctly position infant at breast.  LATCH Score: 8  Interventions Interventions: Support pillows;Position options;Skin to skin;Breast massage;Expressed milk;Hand express;Breast compression  Lactation Tools Discussed/Used Tools: Pump Breast pump type: Double-Electric Breast Pump Pump Review: Setup, frequency, and cleaning;Milk Storage Initiated by:: Allayne Stack RN IBCLC Date initiated:: 05/04/19   Consult Status Consult Status: Follow-up Date: 05/05/19 Follow-up type: In-patient    Theodoro Kalata 05/04/2019, 7:16 AM

## 2019-05-29 ENCOUNTER — Ambulatory Visit (HOSPITAL_COMMUNITY)
Admission: AD | Admit: 2019-05-29 | Discharge: 2019-05-29 | Disposition: A | Discharge status: Home / Self Care | Payer: Medicaid Other | Attending: Obstetrics | Admitting: Obstetrics

## 2019-05-29 ENCOUNTER — Inpatient Hospital Stay (HOSPITAL_COMMUNITY): Payer: Medicaid Other

## 2019-05-29 ENCOUNTER — Inpatient Hospital Stay (HOSPITAL_COMMUNITY): Payer: Medicaid Other | Admitting: Anesthesiology

## 2019-05-29 ENCOUNTER — Encounter (HOSPITAL_COMMUNITY)
Admission: AD | Disposition: A | Discharge status: Home / Self Care | Payer: Self-pay | Source: Home / Self Care | Attending: Obstetrics

## 2019-05-29 ENCOUNTER — Other Ambulatory Visit: Payer: Self-pay

## 2019-05-29 DIAGNOSIS — Z881 Allergy status to other antibiotic agents status: Secondary | ICD-10-CM | POA: Diagnosis not present

## 2019-05-29 DIAGNOSIS — N854 Malposition of uterus: Secondary | ICD-10-CM | POA: Insufficient documentation

## 2019-05-29 DIAGNOSIS — E669 Obesity, unspecified: Secondary | ICD-10-CM | POA: Diagnosis not present

## 2019-05-29 DIAGNOSIS — Z20828 Contact with and (suspected) exposure to other viral communicable diseases: Secondary | ICD-10-CM | POA: Diagnosis not present

## 2019-05-29 HISTORY — PX: DILATION AND EVACUATION: SHX1459

## 2019-05-29 LAB — CBC
HCT: 29.4 % — ABNORMAL LOW (ref 36.0–46.0)
Hemoglobin: 9.1 g/dL — ABNORMAL LOW (ref 12.0–15.0)
MCH: 25.5 pg — ABNORMAL LOW (ref 26.0–34.0)
MCHC: 31 g/dL (ref 30.0–36.0)
MCV: 82.4 fL (ref 80.0–100.0)
Platelets: 145 10*3/uL — ABNORMAL LOW (ref 150–400)
RBC: 3.57 MIL/uL — ABNORMAL LOW (ref 3.87–5.11)
RDW: 14.6 % (ref 11.5–15.5)
WBC: 8.8 10*3/uL (ref 4.0–10.5)
nRBC: 0 % (ref 0.0–0.2)

## 2019-05-29 LAB — TYPE AND SCREEN
ABO/RH(D): O POS
Antibody Screen: NEGATIVE

## 2019-05-29 LAB — SARS CORONAVIRUS 2 BY RT PCR (HOSPITAL ORDER, PERFORMED IN ~~LOC~~ HOSPITAL LAB): SARS Coronavirus 2: NEGATIVE

## 2019-05-29 SURGERY — DILATION AND EVACUATION, UTERUS
Anesthesia: General

## 2019-05-29 MED ORDER — 0.9 % SODIUM CHLORIDE (POUR BTL) OPTIME
TOPICAL | Status: DC | PRN
  Administered 2019-05-29: 06:00:00 1000 mL

## 2019-05-29 MED ORDER — PROPOFOL 10 MG/ML IV BOLUS
INTRAVENOUS | Status: AC
  Filled 2019-05-29: qty 20

## 2019-05-29 MED ORDER — PROPOFOL 10 MG/ML IV BOLUS
INTRAVENOUS | Status: DC | PRN
  Administered 2019-05-29: 200 mg via INTRAVENOUS

## 2019-05-29 MED ORDER — METHYLERGONOVINE MALEATE 0.2 MG/ML IJ SOLN
INTRAMUSCULAR | Status: AC
  Filled 2019-05-29: qty 1

## 2019-05-29 MED ORDER — METOCLOPRAMIDE HCL 5 MG/ML IJ SOLN: 10.0000 mg | Freq: Once | INTRAMUSCULAR | Status: DC | PRN

## 2019-05-29 MED ORDER — FENTANYL CITRATE (PF) 100 MCG/2ML IJ SOLN: 25.0000 ug | INTRAMUSCULAR | Status: DC | PRN

## 2019-05-29 MED ORDER — EPHEDRINE SULFATE 50 MG/ML IJ SOLN
INTRAMUSCULAR | Status: DC | PRN
  Administered 2019-05-29 (×2): 10 mg via INTRAVENOUS

## 2019-05-29 MED ORDER — FENTANYL CITRATE (PF) 250 MCG/5ML IJ SOLN
INTRAMUSCULAR | Status: DC | PRN
  Administered 2019-05-29: 100 ug via INTRAVENOUS

## 2019-05-29 MED ORDER — MIDAZOLAM HCL 2 MG/2ML IJ SOLN
INTRAMUSCULAR | Status: AC
  Filled 2019-05-29: qty 2

## 2019-05-29 MED ORDER — ROCURONIUM BROMIDE 10 MG/ML (PF) SYRINGE
PREFILLED_SYRINGE | INTRAVENOUS | Status: AC
  Filled 2019-05-29: qty 10

## 2019-05-29 MED ORDER — ONDANSETRON HCL 4 MG/2ML IJ SOLN
INTRAMUSCULAR | Status: DC | PRN
  Administered 2019-05-29: 4 mg via INTRAVENOUS

## 2019-05-29 MED ORDER — LACTATED RINGERS IV SOLN
INTRAVENOUS | Status: DC
  Administered 2019-05-29: 04:00:00 via INTRAVENOUS

## 2019-05-29 MED ORDER — METHYLERGONOVINE MALEATE 0.2 MG/ML IJ SOLN
INTRAMUSCULAR | Status: DC | PRN
  Administered 2019-05-29: 0.2 mg via INTRAMUSCULAR

## 2019-05-29 MED ORDER — FENTANYL CITRATE (PF) 250 MCG/5ML IJ SOLN
INTRAMUSCULAR | Status: AC
  Filled 2019-05-29: qty 5

## 2019-05-29 MED ORDER — DEXAMETHASONE SODIUM PHOSPHATE 10 MG/ML IJ SOLN
INTRAMUSCULAR | Status: DC | PRN
  Administered 2019-05-29: 10 mg via INTRAVENOUS

## 2019-05-29 MED ORDER — SUCCINYLCHOLINE 20MG/ML (10ML) SYRINGE FOR MEDFUSION PUMP - OPTIME
INTRAMUSCULAR | Status: DC | PRN
  Administered 2019-05-29: 120 mg via INTRAVENOUS

## 2019-05-29 MED ORDER — MIDAZOLAM HCL 2 MG/2ML IJ SOLN
INTRAMUSCULAR | Status: DC | PRN
  Administered 2019-05-29: 2 mg via INTRAVENOUS

## 2019-05-29 MED ORDER — LIDOCAINE 2% (20 MG/ML) 5 ML SYRINGE
INTRAMUSCULAR | Status: AC
  Filled 2019-05-29: qty 5

## 2019-05-29 MED ORDER — EPHEDRINE 5 MG/ML INJ
INTRAVENOUS | Status: AC
  Filled 2019-05-29: qty 10

## 2019-05-29 MED ORDER — SUCCINYLCHOLINE CHLORIDE 200 MG/10ML IV SOSY
PREFILLED_SYRINGE | INTRAVENOUS | Status: AC
  Filled 2019-05-29: qty 10

## 2019-05-29 MED ORDER — LIDOCAINE HCL (CARDIAC) PF 100 MG/5ML IV SOSY
PREFILLED_SYRINGE | INTRAVENOUS | Status: DC | PRN
  Administered 2019-05-29: 80 mg via INTRATRACHEAL

## 2019-05-29 SURGICAL SUPPLY — 16 items
CANISTER SUCTION 2500CC (MISCELLANEOUS) ×4 IMPLANT
CATH ROBINSON RED A/P 16FR (CATHETERS) ×3 IMPLANT
CONT SPEC 4OZ CLIKSEAL STRL BL (MISCELLANEOUS) ×3 IMPLANT
GLOVE BIO SURGEON STRL SZ7 (GLOVE) ×3 IMPLANT
GLOVE BIOGEL PI IND STRL 6.5 (GLOVE) ×1 IMPLANT
GLOVE BIOGEL PI INDICATOR 6.5 (GLOVE) ×2
GLOVE ECLIPSE 6.0 STRL STRAW (GLOVE) ×3 IMPLANT
GOWN STRL REUS W/ TWL LRG LVL3 (GOWN DISPOSABLE) ×2 IMPLANT
GOWN STRL REUS W/TWL LRG LVL3 (GOWN DISPOSABLE) ×6
KIT TURNOVER KIT B (KITS) ×3 IMPLANT
PACK VAGINAL MINOR WOMEN LF (CUSTOM PROCEDURE TRAY) ×3 IMPLANT
PAD OB MATERNITY 4.3X12.25 (PERSONAL CARE ITEMS) ×3 IMPLANT
SET BERKELEY SUCTION TUBING (SUCTIONS) ×2 IMPLANT
TOWEL GREEN STERILE FF (TOWEL DISPOSABLE) ×6 IMPLANT
UNDERPAD 30X30 (UNDERPADS AND DIAPERS) ×3 IMPLANT
VACURETTE 7MM CVD STRL WRAP (CANNULA) ×2 IMPLANT

## 2019-05-29 NOTE — Anesthesia Preprocedure Evaluation (Addendum)
Anesthesia Evaluation  Patient identified by MRN, date of birth, ID band Patient awake    Reviewed: Allergy & Precautions, NPO status , Patient's Chart, lab work & pertinent test results  History of Anesthesia Complications Negative for: history of anesthetic complications  Airway Mallampati: I  TM Distance: >3 FB Neck ROM: Full    Dental  (+) Teeth Intact, Dental Advisory Given   Pulmonary neg pulmonary ROS,    breath sounds clear to auscultation       Cardiovascular  Rhythm:Regular Rate:Normal   Gestational HTN, resolved    Neuro/Psych negative neurological ROS  negative psych ROS   GI/Hepatic negative GI ROS, Neg liver ROS,   Endo/Other   Obesity   Renal/GU negative Renal ROS     Musculoskeletal negative musculoskeletal ROS (+)   Abdominal   Peds  Hematology  (+) anemia ,  PLT 145k    Anesthesia Other Findings   Reproductive/Obstetrics  Retained POC following NSVD 1 month ago                            Anesthesia Physical Anesthesia Plan  ASA: I and emergent  Anesthesia Plan: General   Post-op Pain Management:    Induction: Intravenous and Rapid sequence  PONV Risk Score and Plan: 3 and Treatment may vary due to age or medical condition, Ondansetron and Dexamethasone  Airway Management Planned: Oral ETT  Additional Equipment: None  Intra-op Plan:   Post-operative Plan: Extubation in OR  Informed Consent: I have reviewed the patients History and Physical, chart, labs and discussed the procedure including the risks, benefits and alternatives for the proposed anesthesia with the patient or authorized representative who has indicated his/her understanding and acceptance.     Dental advisory given  Plan Discussed with: Anesthesiologist and CRNA  Anesthesia Plan Comments:       Anesthesia Quick Evaluation

## 2019-05-29 NOTE — Transfer of Care (Signed)
Immediate Anesthesia Transfer of Care Note  Patient: Leslie Robertson  Procedure(s) Performed: DILATATION AND CURETTAGE (N/A )  Patient Location: PACU  Anesthesia Type:General  Level of Consciousness: awake, alert  and oriented  Airway & Oxygen Therapy: Patient Spontanous Breathing  Post-op Assessment: Report given to RN and Post -op Vital signs reviewed and stable  Post vital signs: Reviewed and stable  Last Vitals:  Vitals Value Taken Time  BP    Temp    Pulse 101 05/29/19 0638  Resp 13 05/29/19 0638  SpO2 99 % 05/29/19 0638  Vitals shown include unvalidated device data.  Last Pain:  Vitals:   05/29/19 0054  TempSrc: Oral  PainSc:          Complications: No apparent anesthesia complications

## 2019-05-29 NOTE — Anesthesia Procedure Notes (Signed)
Procedure Name: Intubation Date/Time: 05/29/2019 6:02 AM Performed by: Valetta Fuller, CRNA Pre-anesthesia Checklist: Patient identified, Emergency Drugs available, Suction available and Patient being monitored Patient Re-evaluated:Patient Re-evaluated prior to induction Oxygen Delivery Method: Circle system utilized Preoxygenation: Pre-oxygenation with 100% oxygen Induction Type: IV induction, Rapid sequence and Cricoid Pressure applied Laryngoscope Size: Miller and 2 Grade View: Grade I Tube type: Oral Tube size: 7.0 mm Number of attempts: 1 Airway Equipment and Method: Stylet Placement Confirmation: ETT inserted through vocal cords under direct vision,  positive ETCO2 and breath sounds checked- equal and bilateral Secured at: 22 cm Tube secured with: Tape Dental Injury: Teeth and Oropharynx as per pre-operative assessment

## 2019-05-29 NOTE — MAU Provider Note (Signed)
Chief Complaint: Vaginal Bleeding and Contractions   First Provider Initiated Contact with Patient 05/29/19 0200      SUBJECTIVE HPI: Leslie Robertson is a 18 y.o. G1P1001 who is 4 weeks s/p NSVD presents to maternity admissions reporting onset of heavy bleeding with large clots today.  She had an episode of bleeding 2 weeks ago and took Cytotec which resolved the bleeding.  She was breastfeeding her baby today when bleeding started. She reports changing 2-3 pads in 3 hours that were fully soaked with dark red bleeding. The bleeding was associated with an episode of dizziness. There are no other symptoms. She denies any pain. She has not tried any other treatments.    HPI  Past Medical History:  Diagnosis Date  . Anemia    Past Surgical History:  Procedure Laterality Date  . HARDWARE REMOVAL Right 05/20/2014   Procedure: HARDWARE REMOVAL RIGHT DISTAL TIBIA;  Surgeon: Kathryne Hitch, MD;  Location: [facility] ORS;  Service: Orthopedics;  Laterality: Right;  . ORIF TIBIA FRACTURE Right 02/01/2014   Procedure: OPEN REDUCTION INTERNAL FIXATION (ORIF) TIBIA FRACTURE;  Surgeon: Budd Palmer, MD;  Location: [facility] OR;  Service: Orthopedics;  Laterality: Right;   Social History   Socioeconomic History  . Marital status: Single    Spouse name: Not on file  . Number of children: Not on file  . Years of education: Not on file  . Highest education level: Not on file  Occupational History  . Not on file  Social Needs  . Financial resource strain: Not on file  . Food insecurity    Worry: Not on file    Inability: Not on file  . Transportation needs    Medical: Not on file    Non-medical: Not on file  Tobacco Use  . Smoking status: Never Smoker  . Smokeless tobacco: Never Used  Substance and Sexual Activity  . Alcohol use: No  . Drug use: No  . Sexual activity: Yes    Birth control/protection: None  Lifestyle  . Physical activity    Days per week: Not on file    Minutes per session: Not  on file  . Stress: Not on file  Relationships  . Social Musician on phone: Not on file    Gets together: Not on file    Attends religious service: Not on file    Active member of club or organization: Not on file    Attends meetings of clubs or organizations: Not on file    Relationship status: Not on file  . Intimate partner violence    Fear of current or ex partner: No    Emotionally abused: No    Physically abused: No    Forced sexual activity: No  Other Topics Concern  . Not on file  Social History Narrative  . Not on file   No current facility-administered medications on file prior to encounter.    Current Outpatient Medications on File Prior to Encounter  Medication Sig Dispense Refill  . prenatal vitamin w/FE, FA (PRENATAL 1 + 1) 27-1 MG TABS tablet Take 1 tablet by mouth daily at 12 noon.    . calcium carbonate (TUMS - DOSED IN MG ELEMENTAL CALCIUM) 500 MG chewable tablet Chew 2 tablets by mouth as needed for indigestion or heartburn.    . iron polysaccharides (NIFEREX) 150 MG capsule Take 1 capsule (150 mg total) by mouth daily. 30 capsule 2   Allergies  Allergen Reactions  .  Omnicef [Cefdinir] Hives  . Triaminic Fever Reducer [Acetaminophen] Hives    ROS:  Review of Systems  Constitutional: Negative for chills, fatigue and fever.  Eyes: Negative for visual disturbance.  Respiratory: Negative for shortness of breath.   Cardiovascular: Negative for chest pain.  Gastrointestinal: Negative for abdominal pain, nausea and vomiting.  Genitourinary: Positive for vaginal bleeding. Negative for difficulty urinating, dysuria, flank pain, pelvic pain, vaginal discharge and vaginal pain.  Neurological: Positive for dizziness. Negative for headaches.  Psychiatric/Behavioral: Negative.      I have reviewed patient's Past Medical Hx, Surgical Hx, Family Hx, Social Hx, medications and allergies.   Physical Exam   Patient Vitals for the past 24 hrs:  BP Temp  Temp src Pulse Resp SpO2  05/29/19 0054 - 98.7 F (37.1 C) Oral - - -  05/29/19 0045 113/68 99.5 F (37.5 C) Oral 96 18 100 %   Constitutional: Well-developed, well-nourished female in no acute distress.  Cardiovascular: normal rate Respiratory: normal effort GI: Abd soft, non-tender. Pos BS x 4 MS: Extremities nontender, no edema, normal ROM Neurologic: Alert and oriented x 4.  GU: Neg CVAT.  PELVIC EXAM: Cervix pink, visually closed, without lesion, moderate amount dark red bleeding requiring 2 fox swabs for visualization of cervix, clear/gray membrane visible coming from cervical os, vaginal walls and external genitalia normal  Membranes grasped with ring forceps but fragile and unable to remove from cervix without breaking    LAB RESULTS Results for orders placed or performed during the hospital encounter of 05/29/19 (from the past 24 hour(s))  CBC     Status: Abnormal   Collection Time: 05/29/19  1:34 AM  Result Value Ref Range   WBC 8.8 4.0 - 10.5 K/uL   RBC 3.57 (L) 3.87 - 5.11 MIL/uL   Hemoglobin 9.1 (L) 12.0 - 15.0 g/dL   HCT 29.4 (L) 36.0 - 46.0 %   MCV 82.4 80.0 - 100.0 fL   MCH 25.5 (L) 26.0 - 34.0 pg   MCHC 31.0 30.0 - 36.0 g/dL   RDW 14.6 11.5 - 15.5 %   Platelets 145 (L) 150 - 400 K/uL   nRBC 0.0 0.0 - 0.2 %    --/--/O POS, O POS Performed at [facility], [address], [place], [place] [address]  (09/06 0120)  IMAGING US Pelvis (transabdominal Only)  Result Date: 05/29/2019 CLINICAL DATA:  Four weeks postpartum with heavy bleeding EXAM: TRANSABDOMINAL ULTRASOUND OF PELVIS TECHNIQUE: Transabdominal ultrasound examination of the pelvis was performed including evaluation of the uterus, ovaries, adnexal regions, and pelvic cul-de-sac. COMPARISON:  None. FINDINGS: Uterus Measurements: 9 x 6 x 7.8 cm = volume: 218.1 mL. No fibroids or other mass visualized. Endometrium Thickness: Focal masslike echogenic area measuring 2.5 x 1.2 x 2.1 cm within the lower  uterine segment. No focal abnormality visualized. Right ovary Measurements: 3.2 x 1.5 x 1.9 cm = volume: 4.6 mL. Normal appearance/no adnexal mass. Left ovary Measurements: 2.5 x 1.8 x 2.2 cm = volume: 5.1 mL. Normal appearance/no adnexal mass. Other findings:  No abnormal free fluid. IMPRESSION: Focal echogenic endometrial mass within the lower uterine segment measuring 2.5 x 1.2 x 2.1 cm. Sonographic findings would be compatible with retained products of conception in the correct clinical setting. Electronically Signed   By: Donavan Foil M.D.   On: 05/29/2019 02:34    MAU Management/MDM: Orders Placed This Encounter  Procedures  . SARS Coronavirus 2 [facility] order, Performed in [facility] hospital lab) Nasopharyngeal Nasopharyngeal  Swab  . US PELVIS (TRANSABDOMINAL ONLY)  . CBC  . [facility]  . Insert peripheral IV    Meds ordered this encounter  Medications  . lactated ringers infusion    Pelvic exam and US c/w retained POCs.  Pt stable, hemoglobin up from hospital discharge to 9.1.  Pt took medication 2 weeks ago, methergine per Dr Chestine Sporelark, and retained POCs still present.  Consult Dr Marice Potterove with assessment and findings. Recommend D&C.  Called Dr Chestine Sporelark who admits pt to OR for D&C.  Dr Chestine Sporelark to bedside to see pt.  Pt breastfeeding and breastpump provided in MAU prior to procedure.  ASSESSMENT 1. Retained products of conception, following delivery with hemorrhage   2. Postpartum hemorrhage, delayed (> 24 hrs), delivered w postpartum cond     PLAN Admit to OR for D&C   Sharen CounterLisa Leftwich-Kirby Certified Nurse-Midwife 05/29/2019  4:00 AM

## 2019-05-29 NOTE — MAU Note (Signed)
Pt states she did feel dizzy when standing after passing the clots.

## 2019-05-29 NOTE — MAU Note (Signed)
Pt report to MAU for labor, ctx started around 9 am, fetal movement felt normally, no bleeding according to pt.

## 2019-05-29 NOTE — H&P (Signed)
18 y.o. G1P1001 is 4 weeks s/p NSVD who presents to [facility]U with sudden onset heavy vaginal bleeding.  She had an episode of heavy bleeding two weeks after delivery for which she received a methergine course.  Bleeding improved, but returned again today.  On exam, she had moderate vaginal bleeding and there were membranes in the cervical canal that are unable to be removed.  Ultrasound showed a thickened endometrial stripe in lower uterine segment that was heterogeneous and consistent with retained products of conception.   Past Medical History:  Diagnosis Date  . Anemia     Past Surgical History:  Procedure Laterality Date  . HARDWARE REMOVAL Right 05/20/2014   Procedure: HARDWARE REMOVAL RIGHT DISTAL TIBIA;  Surgeon: Mcarthur Rossetti, MD;  Location: [facility];  Service: Orthopedics;  Laterality: Right;  . ORIF TIBIA FRACTURE Right 02/01/2014   Procedure: OPEN REDUCTION INTERNAL FIXATION (ORIF) TIBIA FRACTURE;  Surgeon: Rozanna Box, MD;  Location: [facility];  Service: Orthopedics;  Laterality: Right;    OB History  Gravida Para Term Preterm AB Living  1 1 1     1   SAB TAB Ectopic Multiple Live Births        0 1    # Outcome Date GA Lbr Len/2nd Weight Sex Delivery Anes PTL Lv  1 Term 05/02/19 [redacted]w[redacted]d 03:50 / 00:25 3610 g F Vag-Spont EPI  LIV    Social History   Socioeconomic History  . Marital status: Single    Spouse name: Not on file  . Number of children: Not on file  . Years of education: Not on file  . Highest education level: Not on file  Occupational History  . Not on file  Social Needs  . Financial resource strain: Not on file  . Food insecurity    Worry: Not on file    Inability: Not on file  . Transportation needs    Medical: Not on file    Non-medical: Not on file  Tobacco Use  . Smoking status: Never Smoker  . Smokeless tobacco: Never Used  Substance and Sexual Activity  . Alcohol use: No  . Drug use: No  . Sexual activity: Yes    Birth control/protection: None   Omnicef [cefdinir] and Triaminic fever reducer [acetaminophen]     ABO, Rh: --/--/PENDING (10/03 0407) Antibody: PENDING (10/03 0407)  Vitals:   05/29/19 0045 05/29/19 0054  BP: 113/68   Pulse: 96   Resp: 18   Temp: 99.5 F (37.5 C) 98.7 F (37.1 C)  SpO2: 100%      General:  NAD Abdomen:  Soft SSE:  Per CNM exam:  Cervix closed, moderate blood and membranes in cervical os, unable to be removed with ring forceps Ex:  no edema FHTs:  Absent    A/P   18 y.o.  G1P1001 with suspected retained products of conception 4 weeks s/p SVD.   Discussed options.  This is her second episode of heavy vaginal bleeding.  I recommend proceeding to the OR for D&C. Patient  Elects for surgical management.  Discussed risks to include infection, bleeding, damage to surrounding structures (including but not limited to vagina, cervix, bladder, uterus), uterine perforation, need for additional procedures.  All questions answered and patient elects to proceed.   Patient currently breastfeeding.  Pump at bedside.  Will pump again immediately prior to procedure.    [place]

## 2019-05-29 NOTE — Discharge Instructions (Signed)
Pelvic rest x 2 weeks (no intercourse or tampons).  No tub baths or swimming for two weeks.     Call your doctor if you have heavy vaginal bleeding (soaking through a pad an hour or more for >2 hours in a row), temperature >101F, severe nausea, vomiting, severe or worsening abdominal pain, dizziness, shortness of breath, chest pain or any other concerns.

## 2019-05-29 NOTE — MAU Note (Signed)
Pt states she felt a gush at 11am, which was more then what she she been experiencing. She passed 3-4 clots a little bigger then a golf ball.

## 2019-05-29 NOTE — Op Note (Signed)
Pre-Operative Diagnosis: Retained products of conception   Postoperative Diagnosis: Retained products of conception  Procedure: Dilation and curettage  Surgeon: Jerelyn Charles, MD  Operative Findings: Anteverted uterus, moderate vaginal bleeding.  2 cm piece of placenta along with additional products of conception removed during procedure  Specimen: products of conception  EBL: < 50 cc    After adequate anesthesia was achieved, the patient placed in the dorsal lithotomy position in [place].  She was prepped and draped in the usual sterile fashion.  The bivalve speculum was placed in the vagina and the anterior lip of the cervix grasped with a single-tooth tenaculum.  The cervix was serially dilated with Kennon Rounds dilators. . A 49mm  suction curette was advanced to the fundus, the vacuum was engaged, and multiple suction passes were performed until the products of conception were evacuated. A large 2 cm piece of placenta was clearly identified during procedure. Uterus was boggy so uterine massage was performed and IM methergine administered. A Sharp curettage was performed and a gritty texture was noted in all quadrants.  A final suction pass was performed with minimal results.  Bleeding at this time was minimal and uterine tone was firm with bimanual massage. This completed the procedure.  All instruments were removed from the vagina.  Pressure was used to achieve hemostasis at the tenaculum site. The patient tolerated the procedure well was brought to the recovery room in stable condition for the procedure. All sponge and needle counts correct x2.   [place], [facility]

## 2019-05-30 NOTE — Anesthesia Postprocedure Evaluation (Signed)
Anesthesia Post Note  Patient: Leslie Robertson  Procedure(s) Performed: DILATATION AND CURETTAGE (N/A )     Patient location during evaluation: PACU Anesthesia Type: General Level of consciousness: awake and alert Pain management: pain level controlled Vital Signs Assessment: post-procedure vital signs reviewed and stable Respiratory status: spontaneous breathing, nonlabored ventilation and respiratory function stable Cardiovascular status: blood pressure returned to baseline and stable Postop Assessment: no apparent nausea or vomiting Anesthetic complications: no    Last Vitals:  Vitals:   05/29/19 0639 05/29/19 0645  BP: 122/77 112/76  Pulse: 95 88  Resp: 12 20  Temp: 36.6 C   SpO2: 100% 99%    Last Pain:  Vitals:   05/29/19 0645  TempSrc:   PainSc: 0-No pain                 Audry Pili

## 2019-05-31 ENCOUNTER — Encounter (HOSPITAL_COMMUNITY): Payer: Self-pay | Admitting: Obstetrics

## 2019-06-01 LAB — SURGICAL PATHOLOGY

## 2020-01-05 ENCOUNTER — Encounter (HOSPITAL_COMMUNITY): Payer: Self-pay

## 2020-01-05 ENCOUNTER — Emergency Department (HOSPITAL_COMMUNITY)
Admission: EM | Admit: 2020-01-05 | Discharge: 2020-01-06 | Disposition: A | Discharge status: Home / Self Care | Payer: Medicaid Other | Attending: Emergency Medicine | Admitting: Emergency Medicine

## 2020-01-05 DIAGNOSIS — Z5321 Procedure and treatment not carried out due to patient leaving prior to being seen by health care provider: Secondary | ICD-10-CM | POA: Insufficient documentation

## 2020-01-05 DIAGNOSIS — N939 Abnormal uterine and vaginal bleeding, unspecified: Secondary | ICD-10-CM | POA: Diagnosis not present

## 2020-01-05 NOTE — ED Notes (Signed)
No reply for VS over several hours. Not seen in lobby.

## 2020-01-05 NOTE — ED Notes (Signed)
No reply for VS x4.

## 2020-01-05 NOTE — ED Triage Notes (Signed)
Pt reports that she has been having vaginal bleeding that started today, reports that she can feel the string of her IUD and wants it checked.

## 2020-01-05 NOTE — ED Notes (Signed)
No answer for VS x 2 

## 2020-01-10 LAB — I-STAT BETA HCG BLOOD, ED (MC, WL, AP ONLY): I-stat hCG, quantitative: <5 m[IU]/mL (ref <5)

## 2024-04-05 ENCOUNTER — Other Ambulatory Visit: Payer: Self-pay | Admitting: Medical Genetics

## 2024-04-06 ENCOUNTER — Other Ambulatory Visit: Payer: Self-pay

## 2024-06-04 ENCOUNTER — Other Ambulatory Visit: Payer: Self-pay | Admitting: Medical Genetics

## 2024-06-04 DIAGNOSIS — Z006 Encounter for examination for normal comparison and control in clinical research program: Secondary | ICD-10-CM
